# Patient Record
Sex: Male | Born: 1965
Health system: Southern US, Community
[De-identification: ages and names within clinical notes are randomized; demographics above are authoritative.]

## PROBLEM LIST (undated history)

## (undated) DIAGNOSIS — R7989 Other specified abnormal findings of blood chemistry: Secondary | ICD-10-CM

## (undated) DIAGNOSIS — R269 Unspecified abnormalities of gait and mobility: Secondary | ICD-10-CM

## (undated) DIAGNOSIS — Z79899 Other long term (current) drug therapy: Secondary | ICD-10-CM

## (undated) DIAGNOSIS — F329 Major depressive disorder, single episode, unspecified: Secondary | ICD-10-CM

## (undated) DIAGNOSIS — F29 Unspecified psychosis not due to a substance or known physiological condition: Secondary | ICD-10-CM

## (undated) DIAGNOSIS — B20 Human immunodeficiency virus [HIV] disease: Secondary | ICD-10-CM

## (undated) DIAGNOSIS — I10 Essential (primary) hypertension: Secondary | ICD-10-CM

## (undated) DIAGNOSIS — R5383 Other fatigue: Secondary | ICD-10-CM

## (undated) DIAGNOSIS — K2100 Gastro-esophageal reflux disease with esophagitis, without bleeding: Secondary | ICD-10-CM

## (undated) DIAGNOSIS — F71 Moderate intellectual disabilities: Secondary | ICD-10-CM

## (undated) DIAGNOSIS — M79609 Pain in unspecified limb: Secondary | ICD-10-CM

## (undated) DIAGNOSIS — S090XXA Injury of blood vessels of head, not elsewhere classified, initial encounter: Secondary | ICD-10-CM

## (undated) DIAGNOSIS — R5381 Other malaise: Secondary | ICD-10-CM

## (undated) DIAGNOSIS — S159XXA Injury of unspecified blood vessel at neck level, initial encounter: Secondary | ICD-10-CM

## (undated) DIAGNOSIS — F489 Nonpsychotic mental disorder, unspecified: Secondary | ICD-10-CM

## (undated) DIAGNOSIS — R1311 Dysphagia, oral phase: Secondary | ICD-10-CM

## (undated) DIAGNOSIS — G4700 Insomnia, unspecified: Secondary | ICD-10-CM

## (undated) DIAGNOSIS — E785 Hyperlipidemia, unspecified: Secondary | ICD-10-CM

## (undated) DIAGNOSIS — K769 Liver disease, unspecified: Secondary | ICD-10-CM

## (undated) DIAGNOSIS — H6120 Impacted cerumen, unspecified ear: Secondary | ICD-10-CM

## (undated) DIAGNOSIS — L03039 Cellulitis of unspecified toe: Secondary | ICD-10-CM

## (undated) DIAGNOSIS — F3289 Other specified depressive episodes: Secondary | ICD-10-CM

## (undated) DIAGNOSIS — E870 Hyperosmolality and hypernatremia: Secondary | ICD-10-CM

## (undated) HISTORY — DX: Injury of unspecified blood vessel at neck level, initial encounter: S15.9XXA

## (undated) HISTORY — DX: Gastro-esophageal reflux disease with esophagitis, without bleeding: K21.00

## (undated) HISTORY — DX: Injury of unspecified blood vessel at neck level, initial encounter: S09.0XXA

## (undated) HISTORY — DX: Hyperlipidemia, unspecified: E78.5

## (undated) HISTORY — DX: Other long term (current) drug therapy: Z79.899

## (undated) HISTORY — DX: Unspecified psychosis not due to a substance or known physiological condition: F29

## (undated) HISTORY — DX: Liver disease, unspecified: K76.9

## (undated) HISTORY — DX: Other specified depressive episodes: F32.89

## (undated) HISTORY — DX: Other malaise: R53.81

## (undated) HISTORY — DX: Major depressive disorder, single episode, unspecified: F32.9

## (undated) HISTORY — DX: Nonpsychotic mental disorder, unspecified: F48.9

## (undated) HISTORY — DX: Moderate intellectual disabilities: F71

## (undated) HISTORY — DX: Unspecified abnormalities of gait and mobility: R26.9

## (undated) HISTORY — DX: Essential (primary) hypertension: I10

## (undated) HISTORY — DX: Hyperosmolality and hypernatremia: E87.0

## (undated) HISTORY — DX: Impacted cerumen, unspecified ear: H61.20

## (undated) HISTORY — DX: Other fatigue: R53.83

## (undated) HISTORY — DX: Dysphagia, oral phase: R13.11

## (undated) HISTORY — DX: Gastro-esophageal reflux disease with esophagitis: K21.0

## (undated) HISTORY — DX: Pain in unspecified limb: M79.609

## (undated) HISTORY — DX: Human immunodeficiency virus (HIV) disease: B20

## (undated) HISTORY — DX: Other specified abnormal findings of blood chemistry: R79.89

## (undated) HISTORY — PX: CRANIOTOMY: SHX93

## (undated) HISTORY — DX: Insomnia, unspecified: G47.00

## (undated) HISTORY — DX: Cellulitis of unspecified toe: L03.039

---

## 1997-12-27 ENCOUNTER — Emergency Department (HOSPITAL_COMMUNITY): Admission: EM | Admit: 1997-12-27 | Discharge: 1997-12-27 | Payer: Self-pay | Admitting: Emergency Medicine

## 2002-06-06 ENCOUNTER — Inpatient Hospital Stay (HOSPITAL_COMMUNITY): Admission: EM | Admit: 2002-06-06 | Discharge: 2002-06-11 | Payer: Self-pay | Admitting: Psychiatry

## 2003-04-05 DIAGNOSIS — Z9911 Dependence on respirator [ventilator] status: Secondary | ICD-10-CM

## 2003-04-05 DIAGNOSIS — Z8679 Personal history of other diseases of the circulatory system: Secondary | ICD-10-CM

## 2003-04-05 DIAGNOSIS — Z87828 Personal history of other (healed) physical injury and trauma: Secondary | ICD-10-CM

## 2003-04-05 HISTORY — DX: Personal history of other diseases of the circulatory system: Z86.79

## 2003-04-05 HISTORY — DX: Dependence on respirator (ventilator) status: Z99.11

## 2003-04-05 HISTORY — PX: TRACHEAL SURGERY: SHX1096

## 2003-04-05 HISTORY — DX: Personal history of other (healed) physical injury and trauma: Z87.828

## 2003-04-05 HISTORY — PX: PEG PLACEMENT: SHX5437

## 2004-01-07 ENCOUNTER — Inpatient Hospital Stay (HOSPITAL_COMMUNITY): Admission: EM | Admit: 2004-01-07 | Discharge: 2004-03-24 | Payer: Self-pay

## 2004-01-07 ENCOUNTER — Ambulatory Visit: Payer: Self-pay | Admitting: Infectious Diseases

## 2004-06-18 ENCOUNTER — Ambulatory Visit (HOSPITAL_COMMUNITY): Admission: RE | Admit: 2004-06-18 | Discharge: 2004-06-18 | Payer: Self-pay | Admitting: Internal Medicine

## 2005-01-20 ENCOUNTER — Encounter (INDEPENDENT_AMBULATORY_CARE_PROVIDER_SITE_OTHER): Payer: Self-pay | Admitting: *Deleted

## 2005-01-20 ENCOUNTER — Ambulatory Visit: Payer: Self-pay | Admitting: Infectious Diseases

## 2005-01-20 ENCOUNTER — Ambulatory Visit (HOSPITAL_COMMUNITY): Admission: RE | Admit: 2005-01-20 | Discharge: 2005-01-20 | Payer: Self-pay | Admitting: Infectious Diseases

## 2005-01-20 LAB — CONVERTED CEMR LAB
CD4 Count: 290 microliters
CD4 T Cell Abs: 290

## 2005-02-23 ENCOUNTER — Ambulatory Visit: Payer: Self-pay | Admitting: Infectious Diseases

## 2005-04-28 ENCOUNTER — Encounter (INDEPENDENT_AMBULATORY_CARE_PROVIDER_SITE_OTHER): Payer: Self-pay | Admitting: *Deleted

## 2005-04-28 ENCOUNTER — Ambulatory Visit: Payer: Self-pay | Admitting: Infectious Diseases

## 2005-04-28 LAB — CONVERTED CEMR LAB
CD4 Count: 260 microliters
HIV 1 RNA Quant: 399 copies/mL

## 2005-05-05 ENCOUNTER — Ambulatory Visit (HOSPITAL_COMMUNITY): Admission: RE | Admit: 2005-05-05 | Discharge: 2005-05-05 | Payer: Self-pay | Admitting: Internal Medicine

## 2005-07-14 ENCOUNTER — Encounter (INDEPENDENT_AMBULATORY_CARE_PROVIDER_SITE_OTHER): Payer: Self-pay | Admitting: *Deleted

## 2005-07-14 ENCOUNTER — Encounter: Admission: RE | Admit: 2005-07-14 | Discharge: 2005-07-14 | Payer: Self-pay | Admitting: Infectious Diseases

## 2005-07-14 ENCOUNTER — Ambulatory Visit: Payer: Self-pay | Admitting: Infectious Diseases

## 2005-07-14 LAB — CONVERTED CEMR LAB
CD4 Count: 490 microliters
HIV 1 RNA Quant: 399 copies/mL

## 2006-04-13 ENCOUNTER — Encounter: Admission: RE | Admit: 2006-04-13 | Discharge: 2006-04-13 | Payer: Self-pay | Admitting: Infectious Diseases

## 2006-04-13 ENCOUNTER — Encounter (INDEPENDENT_AMBULATORY_CARE_PROVIDER_SITE_OTHER): Payer: Self-pay | Admitting: *Deleted

## 2006-04-13 ENCOUNTER — Ambulatory Visit: Payer: Self-pay | Admitting: Infectious Diseases

## 2006-04-13 LAB — CONVERTED CEMR LAB
CD4 Count: 280 microliters
HIV 1 RNA Quant: 109 copies/mL

## 2006-05-29 ENCOUNTER — Encounter (INDEPENDENT_AMBULATORY_CARE_PROVIDER_SITE_OTHER): Payer: Self-pay | Admitting: *Deleted

## 2006-05-29 LAB — CONVERTED CEMR LAB

## 2006-06-11 ENCOUNTER — Encounter (INDEPENDENT_AMBULATORY_CARE_PROVIDER_SITE_OTHER): Payer: Self-pay | Admitting: *Deleted

## 2006-09-18 DIAGNOSIS — B20 Human immunodeficiency virus [HIV] disease: Secondary | ICD-10-CM

## 2006-09-20 IMAGING — CR DG ANKLE PORT 2V*R*
1 series · 1 of 1 positions shown · non-contrast
Comparison: Prior two-view study on the same day.

CLINICAL DATA: Trauma.

RIGHT ANKLE, SINGLE VIEW

[view not recorded]
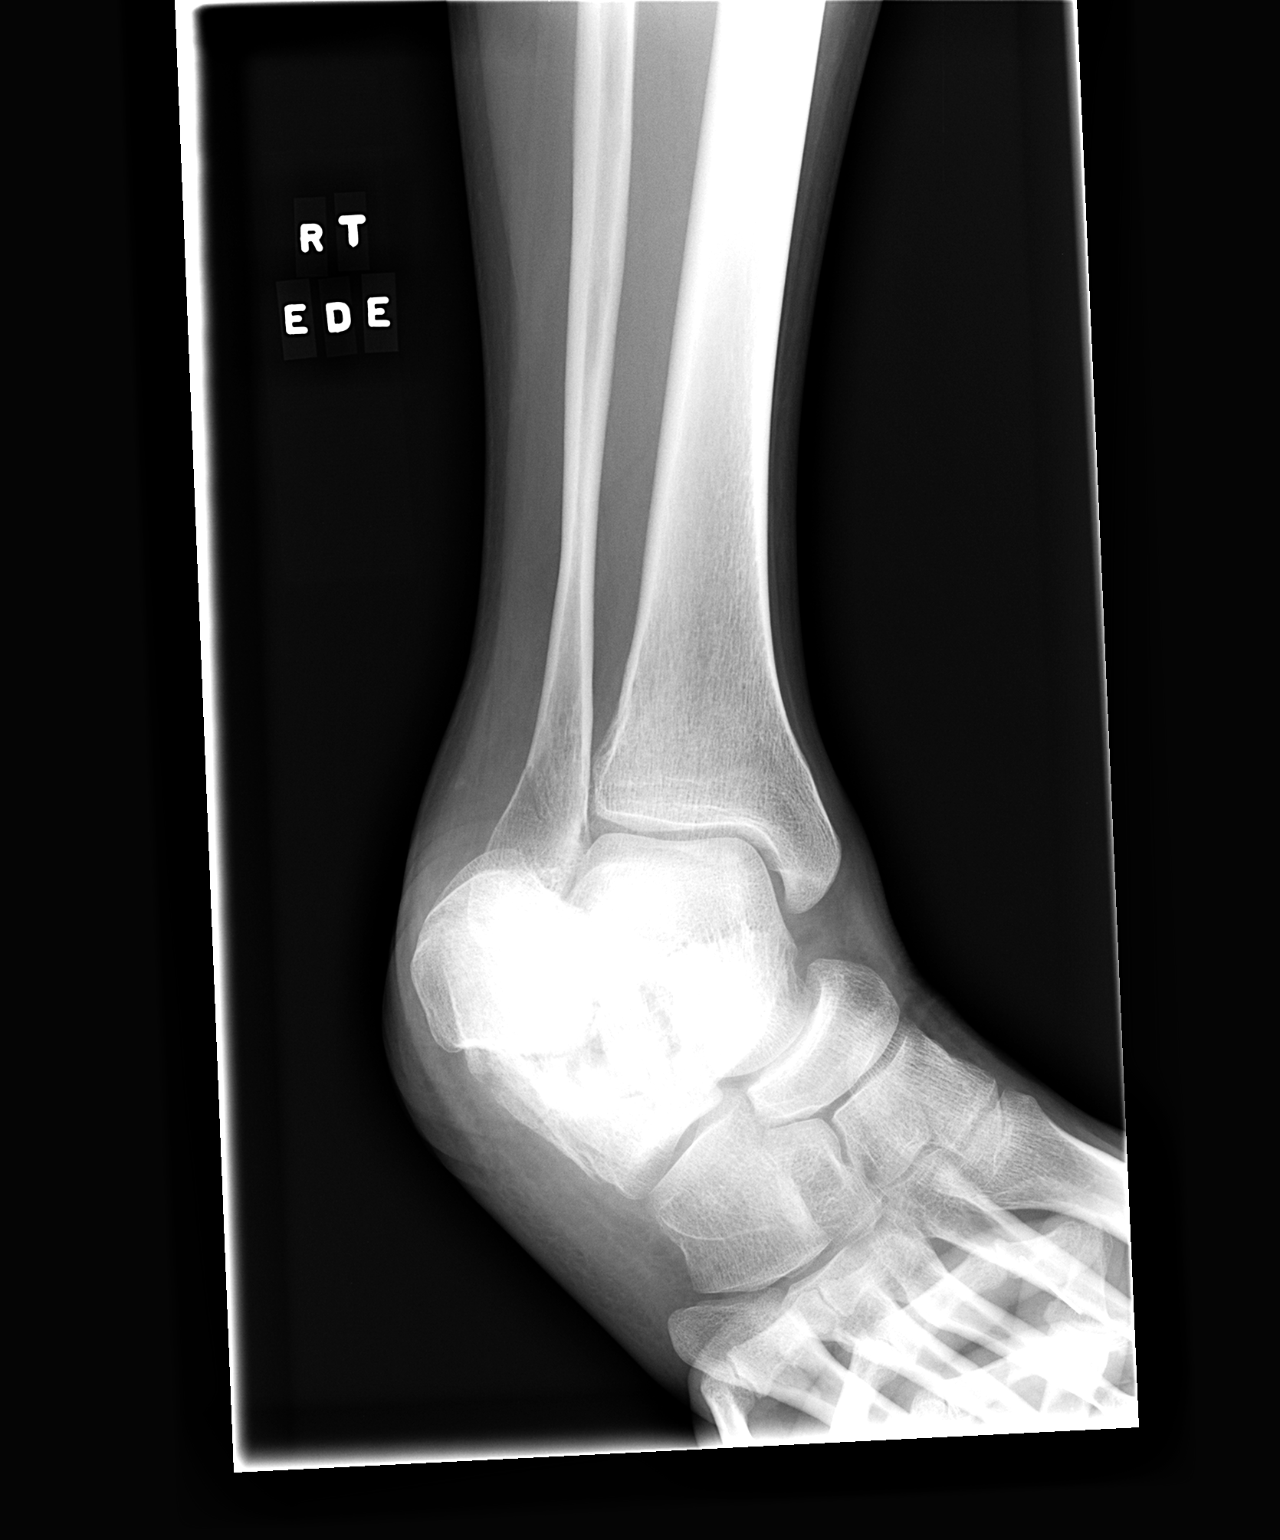

[1 of 1 positions shown; findings below may reference images not displayed]

Additional oblique view of the right ankle was obtained to supplement the AP and lateral views
obtained previously. There is an oblique nondisplaced fracture through the lateral malleolus which
was not appreciated on the prior study. Complex fracture seen through the mid calcaneus. It is
unclear whether fracture also extends through the talus.

IMPRESSION

Nondisplaced fracture of distal fibula. Complex comminuted fracture of the calcaneus. Talar
fracture is not completely excluded. CT of the right foot and ankle may be helpful to further
clarify injuries.

## 2006-09-20 IMAGING — CR DG CHEST 1V PORT
1 series · 1 of 1 positions shown · non-contrast
Comparison: 3757 hours

Clinical:   Trauma. Status post intubation.

Chest, single view, 7293 hours

[view not recorded]
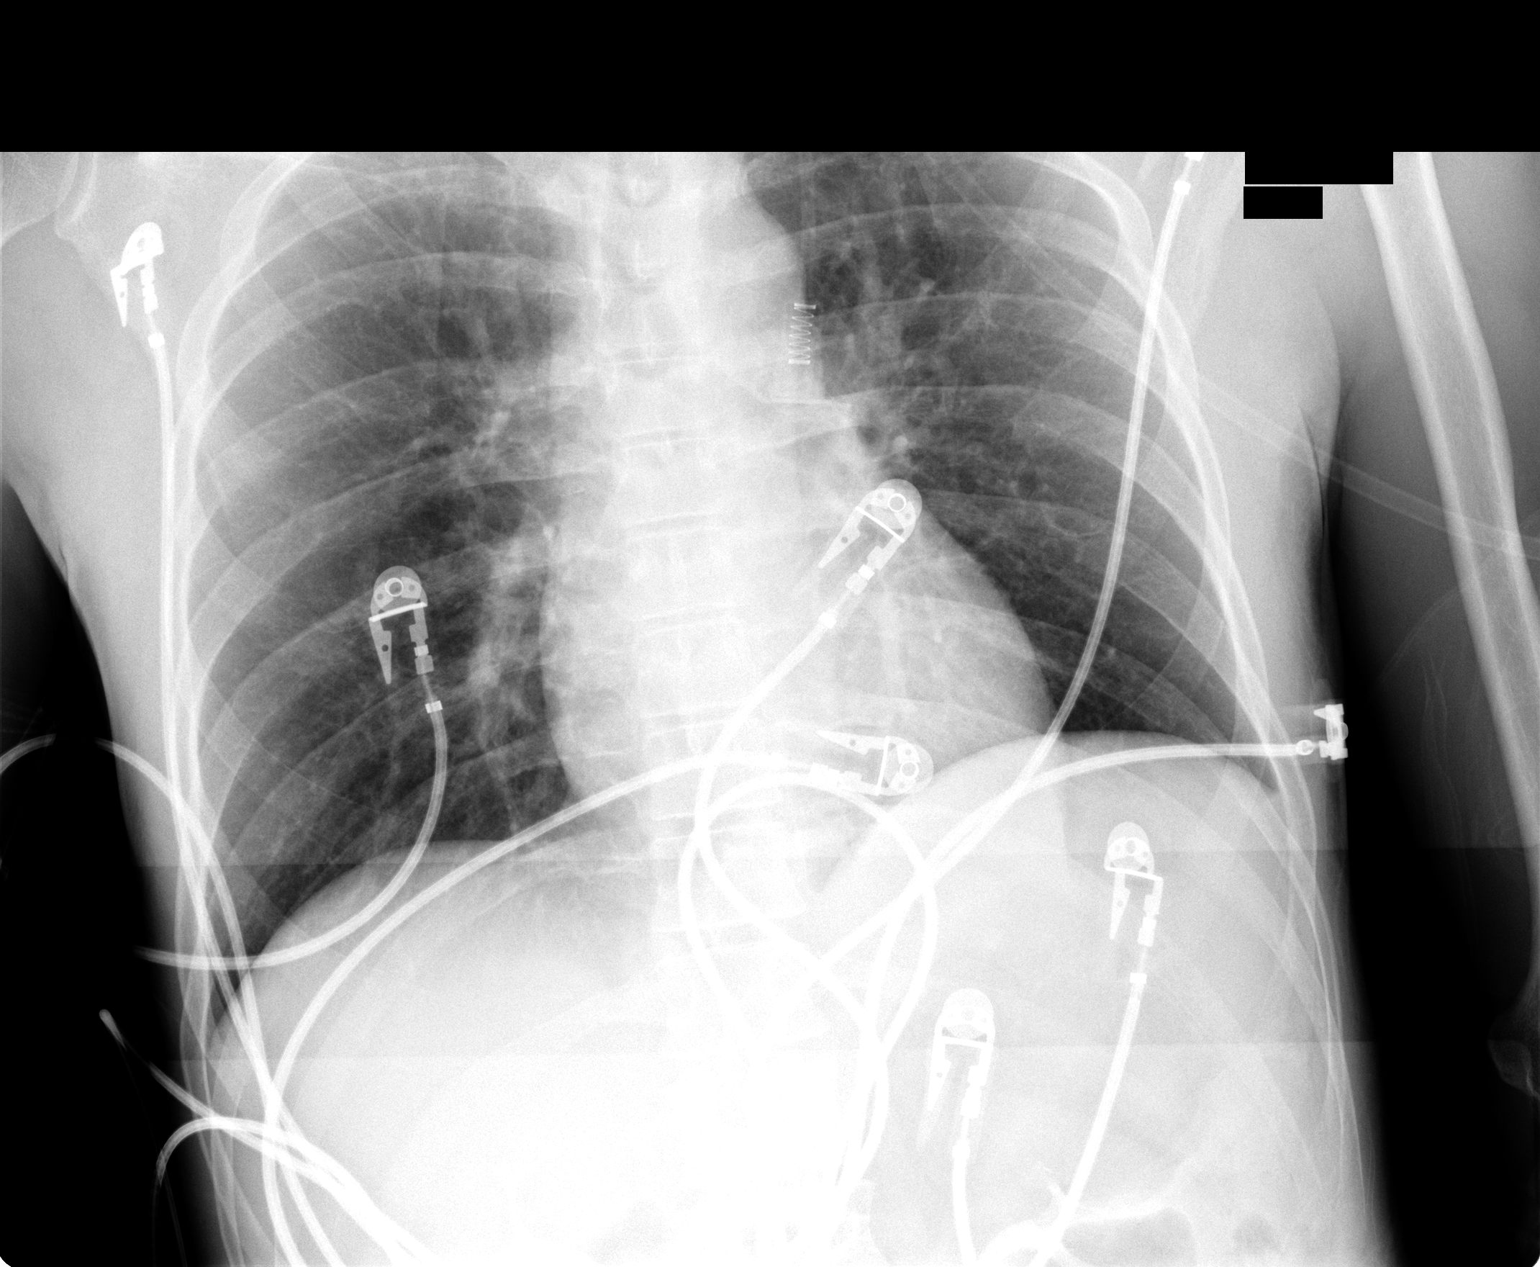

[1 of 1 positions shown; findings below may reference images not displayed]

The patient has been intubated. The endotracheal tube lies approximately 5 cm above the carina.
Lung show stable appearance without pneumothorax or focal pulmonary density.
IMPRESSION: Endotracheal tube tip lies 5 cm above the carina.

## 2006-09-21 IMAGING — CT CT HEAD W/O CM
1 of 2 series · 13 of 30 positions shown, 17 images · non-contrast
Comparison: 01/07/04.

CLINICAL DATA: CT HEAD WITHOUT CONTRAST 01/08/04
TECHNIQUE: Standard noncontrast axial head CT.

[Series 2: brain · axial · 0.47mm/px · z∈[+148,+289]mm · 13 of 36 slices shown, 17 images]
[im 3/36  brain]
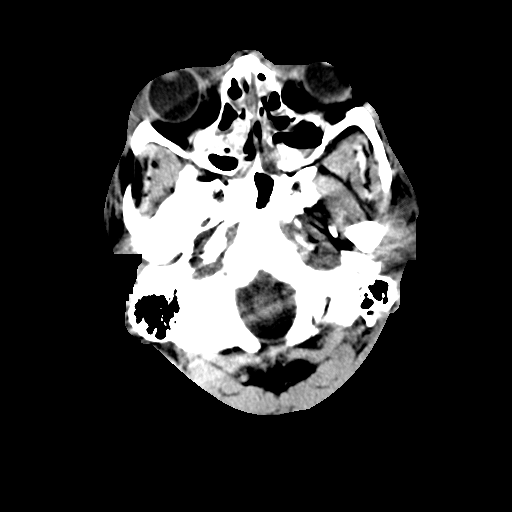
[im 3/36  bone]
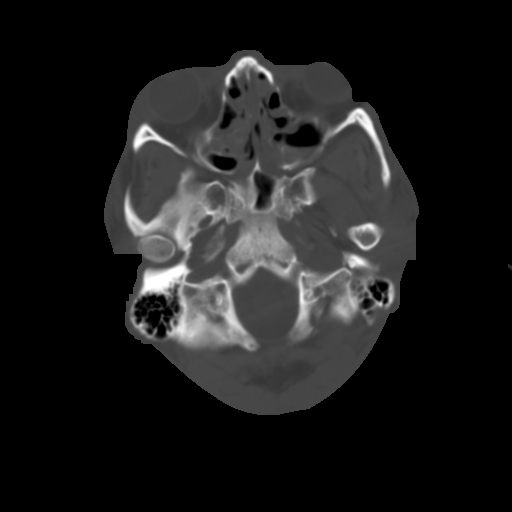
[im 6/36  brain]
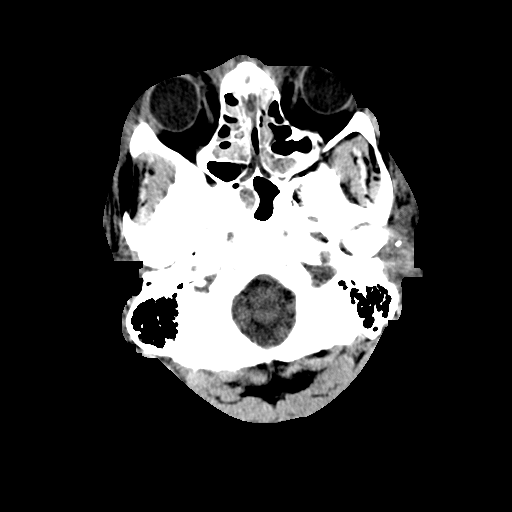
[im 8/36  brain]
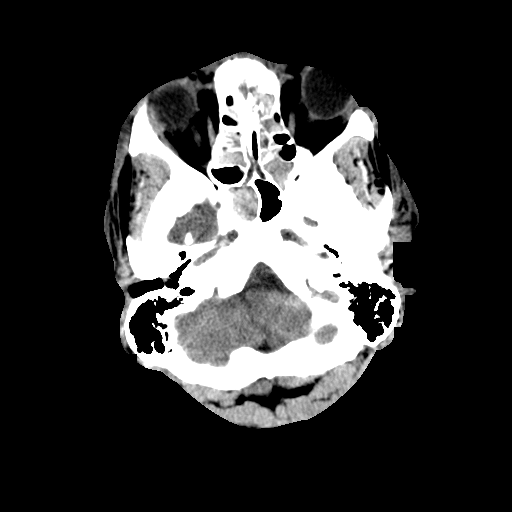
[im 11/36  brain]
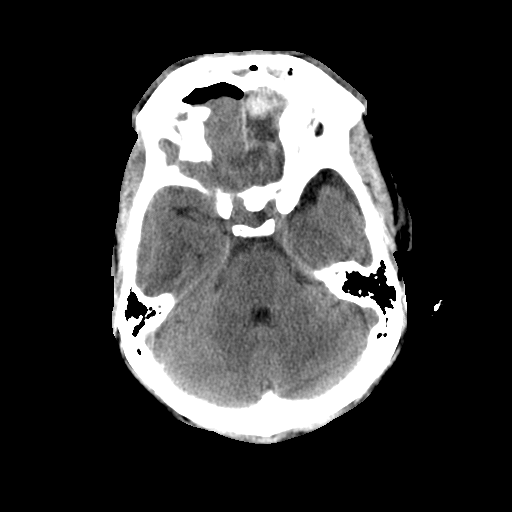
[im 13/36  brain]
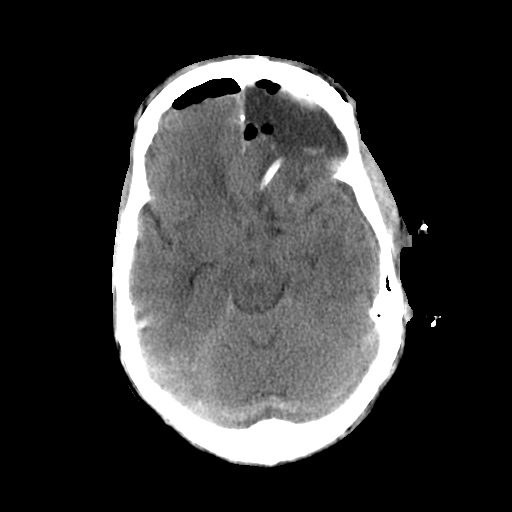
[im 13/36  bone]
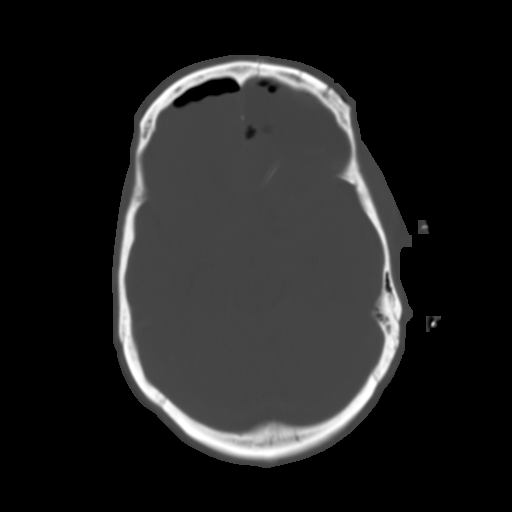
[im 16/36  brain]
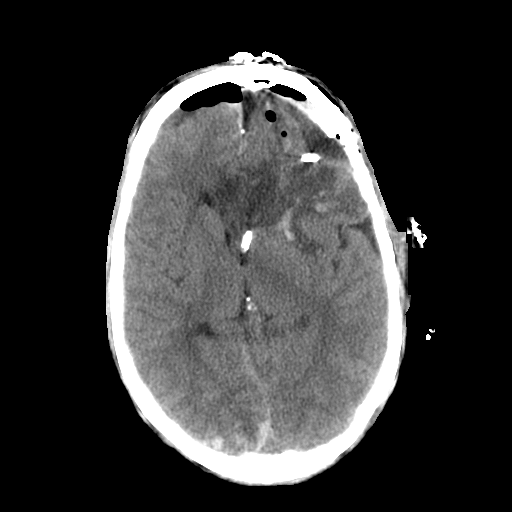
[im 18/36  brain]
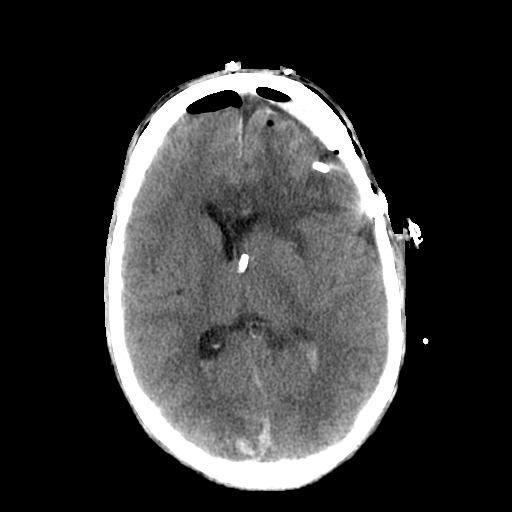
[im 21/36  brain]
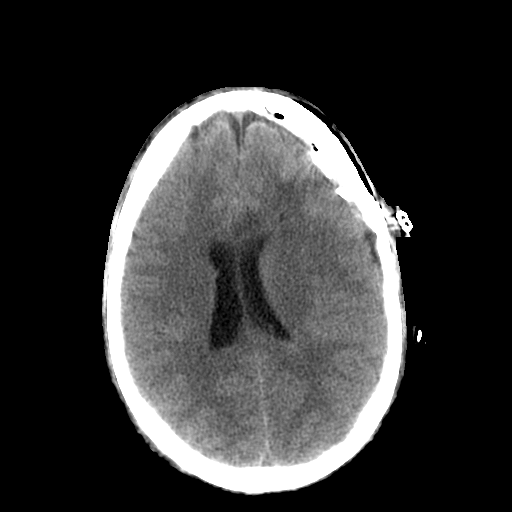
[im 23/36  brain]
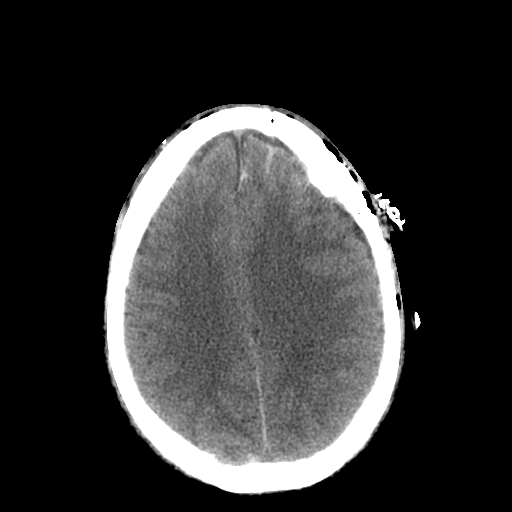
[im 23/36  bone]
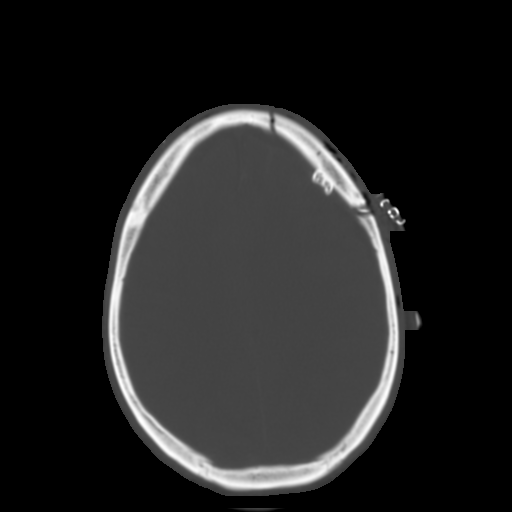
[im 26/36  brain]
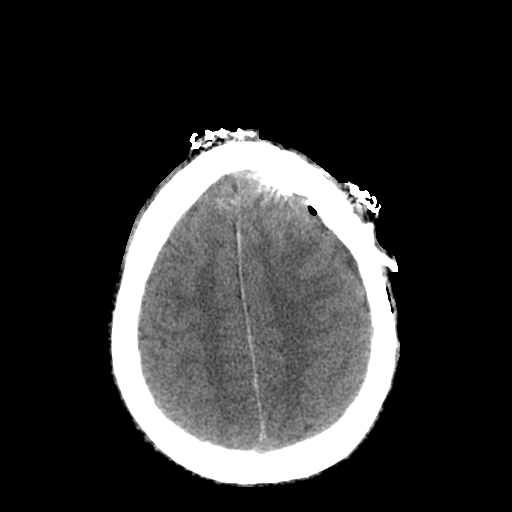
[im 28/36  brain]
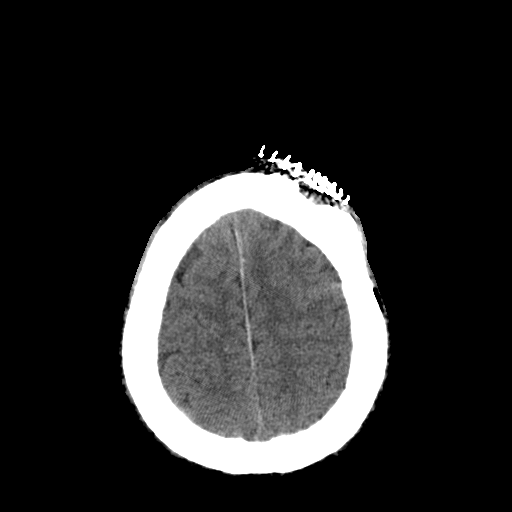
[im 31/36  brain]
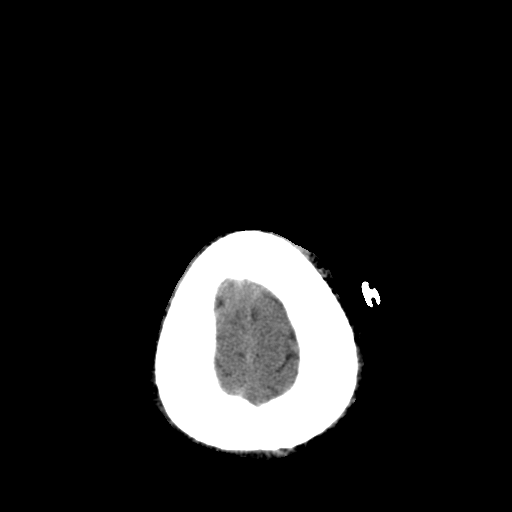
[im 33/36  brain]
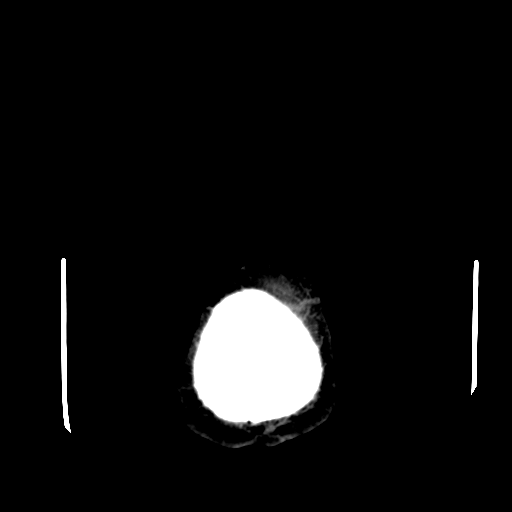
[im 33/36  bone]
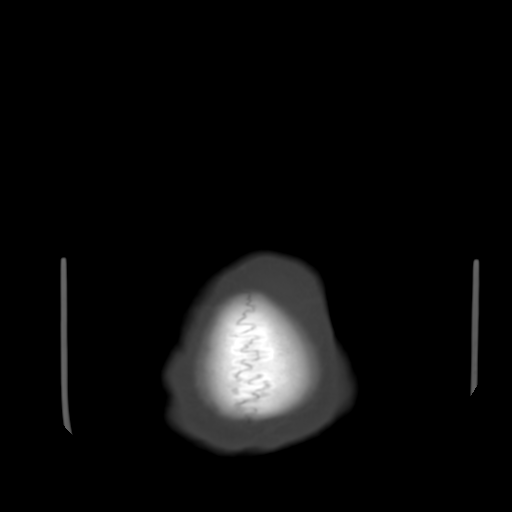

[13 of 30 positions shown; findings below may reference images not displayed]

FINDINGS: Compared to the prior study, the large left frontal lobe intracranial hemorrhage has been nearly completely evacuated.  A small amount of hemorrhage remains adjacent to the right frontal horn extending inferiorly.  Diffuse hypoattenuation is evident throughout the left frontal lobe.  There are two surgical drains in place, one which extends through the left frontal lobe into the ventricular system and a second which is subdural in location overlying the left frontal lobe.  There are scattered foci of gas throughout the brain consistent with pneumocephalus.  There is less midline shift now measuring approximately 9 mm, previously measuring 15 mm.  Intraventricular hemorrhage is present in the occipital horns bilaterally.  Medial right frontal hemorrhagic contusion is again noted.  Diffuse scattered subarachnoid blood is evident particularly on the left side.  Also some of the subdural blood on the left side has been evacuated from the surgery.  Minimal subdural hemorrhage is present posteriorly along the falx and the tentorium on the right.  Basilar cisterns remain patent. 
Patient is status post recent left frontal craniotomy.  
Complex fractures are again noted through the superior right orbit extending into the frontal and ethmoidal sinuses.  Diffuse sinus fluid and secretions are noted.  
IMPRESSION
1.  Status post left frontal craniotomy with evacuation of an acute left frontal expanding intracranial hemorrhage.  Improvement also in the amount of left subdural hemorrhage.  
2.  Less left to right midline shift now measuring 9 mm previously measuring 15 mm.

## 2006-10-24 DIAGNOSIS — T148XXA Other injury of unspecified body region, initial encounter: Secondary | ICD-10-CM | POA: Insufficient documentation

## 2006-10-24 DIAGNOSIS — Z87828 Personal history of other (healed) physical injury and trauma: Secondary | ICD-10-CM

## 2006-12-28 ENCOUNTER — Ambulatory Visit: Payer: Self-pay | Admitting: Infectious Diseases

## 2006-12-28 ENCOUNTER — Encounter: Admission: RE | Admit: 2006-12-28 | Discharge: 2006-12-28 | Payer: Self-pay | Admitting: Infectious Diseases

## 2006-12-28 LAB — CONVERTED CEMR LAB
ALT: 79 units/L — ABNORMAL HIGH (ref 0–53)
AST: 35 units/L (ref 0–37)
Albumin: 4.4 g/dL (ref 3.5–5.2)
Alkaline Phosphatase: 191 units/L — ABNORMAL HIGH (ref 39–117)
BUN: 13 mg/dL (ref 6–23)
Basophils Absolute: 0.1 10*3/uL (ref 0.0–0.1)
Basophils Relative: 1 % (ref 0–1)
CO2: 21 meq/L (ref 19–32)
Calcium: 8.9 mg/dL (ref 8.4–10.5)
Chloride: 115 meq/L — ABNORMAL HIGH (ref 96–112)
Creatinine, Ser: 0.86 mg/dL (ref 0.40–1.50)
Eosinophils Absolute: 0.1 10*3/uL (ref 0.0–0.7)
Eosinophils Relative: 1 % (ref 0–5)
Glucose, Bld: 90 mg/dL (ref 70–99)
HCT: 45.5 % (ref 39.0–52.0)
HIV 1 RNA Quant: 363 copies/mL — ABNORMAL HIGH (ref <50)
HIV-1 RNA Quant, Log: 2.56 — ABNORMAL HIGH (ref <1.70)
Hemoglobin: 14.6 g/dL (ref 13.0–17.0)
Lymphocytes Relative: 35 % (ref 12–46)
Lymphs Abs: 2.4 10*3/uL (ref 0.7–3.3)
MCHC: 32.1 g/dL (ref 30.0–36.0)
MCV: 89.7 fL (ref 78.0–100.0)
Monocytes Absolute: 0.3 10*3/uL (ref 0.2–0.7)
Monocytes Relative: 4 % (ref 3–11)
Neutro Abs: 4.2 10*3/uL (ref 1.7–7.7)
Neutrophils Relative %: 60 % (ref 43–77)
Platelets: 312 10*3/uL (ref 150–400)
Potassium: 3.7 meq/L (ref 3.5–5.3)
RBC: 5.07 M/uL (ref 4.22–5.81)
RDW: 16.2 % — ABNORMAL HIGH (ref 11.5–14.0)
Sodium: 148 meq/L — ABNORMAL HIGH (ref 135–145)
Total Bilirubin: 0.3 mg/dL (ref 0.3–1.2)
Total Protein: 7.8 g/dL (ref 6.0–8.3)
WBC: 7 10*3/uL (ref 4.0–10.5)

## 2006-12-29 ENCOUNTER — Encounter (INDEPENDENT_AMBULATORY_CARE_PROVIDER_SITE_OTHER): Payer: Self-pay | Admitting: Licensed Clinical Social Worker

## 2007-04-03 ENCOUNTER — Encounter: Payer: Self-pay | Admitting: Infectious Diseases

## 2008-06-19 ENCOUNTER — Ambulatory Visit: Payer: Self-pay | Admitting: Infectious Diseases

## 2008-06-19 ENCOUNTER — Encounter (INDEPENDENT_AMBULATORY_CARE_PROVIDER_SITE_OTHER): Payer: Self-pay | Admitting: Licensed Clinical Social Worker

## 2008-06-19 LAB — CONVERTED CEMR LAB
ALT: 53 units/L (ref 0–53)
AST: 31 units/L (ref 0–37)
Albumin: 4.5 g/dL (ref 3.5–5.2)
Alkaline Phosphatase: 189 units/L — ABNORMAL HIGH (ref 39–117)
BUN: 13 mg/dL (ref 6–23)
Basophils Absolute: 0 10*3/uL (ref 0.0–0.1)
Basophils Relative: 1 % (ref 0–1)
CO2: 20 meq/L (ref 19–32)
Calcium: 8.9 mg/dL (ref 8.4–10.5)
Chloride: 110 meq/L (ref 96–112)
Cholesterol: 201 mg/dL — ABNORMAL HIGH (ref 0–200)
Creatinine, Ser: 0.98 mg/dL (ref 0.40–1.50)
Eosinophils Absolute: 0.1 10*3/uL (ref 0.0–0.7)
Eosinophils Relative: 2 % (ref 0–5)
Glucose, Bld: 78 mg/dL (ref 70–99)
HCT: 44.5 % (ref 39.0–52.0)
HDL: 48 mg/dL (ref >39)
HIV 1 RNA Quant: 302 copies/mL — ABNORMAL HIGH (ref <48)
HIV-1 RNA Quant, Log: 2.48 — ABNORMAL HIGH (ref <1.68)
Hemoglobin: 14.3 g/dL (ref 13.0–17.0)
LDL Cholesterol: 133 mg/dL — ABNORMAL HIGH (ref 0–99)
Lymphocytes Relative: 34 % (ref 12–46)
Lymphs Abs: 1.8 10*3/uL (ref 0.7–4.0)
MCHC: 32.1 g/dL (ref 30.0–36.0)
MCV: 90.3 fL (ref 78.0–100.0)
Monocytes Absolute: 0.2 10*3/uL (ref 0.1–1.0)
Monocytes Relative: 4 % (ref 3–12)
Neutro Abs: 3.1 10*3/uL (ref 1.7–7.7)
Neutrophils Relative %: 59 % (ref 43–77)
Platelets: 292 10*3/uL (ref 150–400)
Potassium: 4.1 meq/L (ref 3.5–5.3)
RBC: 4.93 M/uL (ref 4.22–5.81)
RDW: 16.5 % — ABNORMAL HIGH (ref 11.5–15.5)
Sodium: 144 meq/L (ref 135–145)
Total Bilirubin: 0.3 mg/dL (ref 0.3–1.2)
Total CHOL/HDL Ratio: 4.2
Total Protein: 7.1 g/dL (ref 6.0–8.3)
Triglycerides: 101 mg/dL (ref <150)
VLDL: 20 mg/dL (ref 0–40)
WBC: 5.3 10*3/uL (ref 4.0–10.5)

## 2008-07-03 ENCOUNTER — Ambulatory Visit: Payer: Self-pay | Admitting: Infectious Diseases

## 2008-12-11 ENCOUNTER — Ambulatory Visit: Payer: Self-pay | Admitting: Infectious Diseases

## 2008-12-11 LAB — CONVERTED CEMR LAB
HIV 1 RNA Quant: 188 copies/mL — ABNORMAL HIGH (ref <48)
HIV-1 RNA Quant, Log: 2.27 — ABNORMAL HIGH (ref <1.68)

## 2008-12-12 ENCOUNTER — Encounter: Payer: Self-pay | Admitting: Infectious Diseases

## 2008-12-12 LAB — CONVERTED CEMR LAB
ALT: 37 units/L (ref 0–53)
AST: 22 units/L (ref 0–37)
Albumin: 4.3 g/dL (ref 3.5–5.2)
Alkaline Phosphatase: 138 units/L — ABNORMAL HIGH (ref 39–117)
BUN: 12 mg/dL (ref 6–23)
Basophils Absolute: 0.1 10*3/uL (ref 0.0–0.1)
Basophils Relative: 1 % (ref 0–1)
CO2: 22 meq/L (ref 19–32)
Calcium: 8.9 mg/dL (ref 8.4–10.5)
Chloride: 103 meq/L (ref 96–112)
Creatinine, Ser: 1.24 mg/dL (ref 0.40–1.50)
Eosinophils Absolute: 0.1 10*3/uL (ref 0.0–0.7)
Eosinophils Relative: 2 % (ref 0–5)
Glucose, Bld: 105 mg/dL — ABNORMAL HIGH (ref 70–99)
HCT: 42.2 % (ref 39.0–52.0)
Hemoglobin: 13.8 g/dL (ref 13.0–17.0)
Lymphocytes Relative: 39 % (ref 12–46)
Lymphs Abs: 2.1 10*3/uL (ref 0.7–4.0)
MCHC: 32.7 g/dL (ref 30.0–36.0)
MCV: 86.3 fL (ref >=78.0)
Monocytes Absolute: 0.3 10*3/uL (ref 0.1–1.0)
Monocytes Relative: 5 % (ref 3–12)
Neutro Abs: 2.8 10*3/uL (ref 1.7–7.7)
Neutrophils Relative %: 52 % (ref 43–77)
Platelets: 287 10*3/uL (ref 150–400)
Potassium: 4 meq/L (ref 3.5–5.3)
RBC: 4.89 M/uL (ref 4.22–5.81)
RDW: 14.9 % (ref 11.5–15.5)
Sodium: 136 meq/L (ref 135–145)
Total Bilirubin: 0.3 mg/dL (ref 0.3–1.2)
Total Protein: 7.4 g/dL (ref 6.0–8.3)
WBC: 5.4 10*3/uL (ref 4.0–10.5)

## 2008-12-18 ENCOUNTER — Ambulatory Visit: Payer: Self-pay | Admitting: Infectious Diseases

## 2009-03-24 ENCOUNTER — Encounter: Payer: Self-pay | Admitting: Infectious Diseases

## 2009-08-27 ENCOUNTER — Ambulatory Visit: Payer: Self-pay | Admitting: Infectious Diseases

## 2009-08-27 LAB — CONVERTED CEMR LAB
ALT: 53 units/L (ref 0–53)
AST: 25 units/L (ref 0–37)
Albumin: 4.4 g/dL (ref 3.5–5.2)
Alkaline Phosphatase: 141 units/L — ABNORMAL HIGH (ref 39–117)
BUN: 16 mg/dL (ref 6–23)
Basophils Absolute: 0.1 10*3/uL (ref 0.0–0.1)
Basophils Relative: 1 % (ref 0–1)
CO2: 23 meq/L (ref 19–32)
Calcium: 9.3 mg/dL (ref 8.4–10.5)
Chloride: 102 meq/L (ref 96–112)
Cholesterol: 168 mg/dL (ref 0–200)
Creatinine, Ser: 1.4 mg/dL (ref 0.40–1.50)
Eosinophils Absolute: 0.1 10*3/uL (ref 0.0–0.7)
Eosinophils Relative: 2 % (ref 0–5)
Glucose, Bld: 84 mg/dL (ref 70–99)
HCT: 39.6 % (ref 39.0–52.0)
HDL: 44 mg/dL (ref >39)
HIV 1 RNA Quant: <48 copies/mL (ref <48)
HIV-1 RNA Quant, Log: <1.68 (ref <1.68)
Hemoglobin: 13.3 g/dL (ref 13.0–17.0)
LDL Cholesterol: 105 mg/dL — ABNORMAL HIGH (ref 0–99)
Lymphocytes Relative: 38 % (ref 12–46)
Lymphs Abs: 2.2 10*3/uL (ref 0.7–4.0)
MCHC: 33.6 g/dL (ref 30.0–36.0)
MCV: 82.3 fL (ref 78.0–100.0)
Monocytes Absolute: 0.4 10*3/uL (ref 0.1–1.0)
Monocytes Relative: 7 % (ref 3–12)
Neutro Abs: 3 10*3/uL (ref 1.7–7.7)
Neutrophils Relative %: 53 % (ref 43–77)
Platelets: 327 10*3/uL (ref 150–400)
Potassium: 3.7 meq/L (ref 3.5–5.3)
RBC: 4.81 M/uL (ref 4.22–5.81)
RDW: 14.4 % (ref 11.5–15.5)
Sodium: 137 meq/L (ref 135–145)
Total Bilirubin: 0.4 mg/dL (ref 0.3–1.2)
Total CHOL/HDL Ratio: 3.8
Total Protein: 7.3 g/dL (ref 6.0–8.3)
Triglycerides: 96 mg/dL (ref <150)
VLDL: 19 mg/dL (ref 0–40)
WBC: 5.7 10*3/uL (ref 4.0–10.5)

## 2009-09-10 ENCOUNTER — Ambulatory Visit: Payer: Self-pay | Admitting: Infectious Diseases

## 2010-01-21 ENCOUNTER — Ambulatory Visit: Payer: Self-pay | Admitting: Infectious Diseases

## 2010-01-21 LAB — CONVERTED CEMR LAB
ALT: 41 units/L (ref 0–53)
AST: 24 units/L (ref 0–37)
Albumin: 4.7 g/dL (ref 3.5–5.2)
Alkaline Phosphatase: 127 units/L — ABNORMAL HIGH (ref 39–117)
BUN: 16 mg/dL (ref 6–23)
Basophils Absolute: 0.1 10*3/uL (ref 0.0–0.1)
Basophils Relative: 1 % (ref 0–1)
CO2: 25 meq/L (ref 19–32)
Calcium: 9.7 mg/dL (ref 8.4–10.5)
Chloride: 102 meq/L (ref 96–112)
Cholesterol: 215 mg/dL — ABNORMAL HIGH (ref 0–200)
Creatinine, Ser: 1.22 mg/dL (ref 0.40–1.50)
Eosinophils Absolute: 0.1 10*3/uL (ref 0.0–0.7)
Eosinophils Relative: 2 % (ref 0–5)
Glucose, Bld: 81 mg/dL (ref 70–99)
HCT: 43.3 % (ref 39.0–52.0)
HDL: 48 mg/dL (ref >39)
HIV 1 RNA Quant: <20 copies/mL (ref <20)
HIV-1 RNA Quant, Log: <1.3 (ref <1.30)
Hemoglobin: 14.5 g/dL (ref 13.0–17.0)
LDL Cholesterol: 140 mg/dL — ABNORMAL HIGH (ref 0–99)
Lymphocytes Relative: 35 % (ref 12–46)
Lymphs Abs: 1.7 10*3/uL (ref 0.7–4.0)
MCHC: 33.5 g/dL (ref 30.0–36.0)
MCV: 84.6 fL (ref 78.0–100.0)
Monocytes Absolute: 0.3 10*3/uL (ref 0.1–1.0)
Monocytes Relative: 5 % (ref 3–12)
Neutro Abs: 2.9 10*3/uL (ref 1.7–7.7)
Neutrophils Relative %: 58 % (ref 43–77)
Platelets: 293 10*3/uL (ref 150–400)
Potassium: 4 meq/L (ref 3.5–5.3)
RBC: 5.12 M/uL (ref 4.22–5.81)
RDW: 15.7 % — ABNORMAL HIGH (ref 11.5–15.5)
Sodium: 139 meq/L (ref 135–145)
TSH: 2.074 microintl units/mL (ref 0.350–4.500)
Total Bilirubin: 0.3 mg/dL (ref 0.3–1.2)
Total CHOL/HDL Ratio: 4.5
Total Protein: 7.8 g/dL (ref 6.0–8.3)
Triglycerides: 133 mg/dL (ref <150)
VLDL: 27 mg/dL (ref 0–40)
WBC: 5 10*3/uL (ref 4.0–10.5)

## 2010-01-29 ENCOUNTER — Ambulatory Visit: Payer: Self-pay | Admitting: Infectious Diseases

## 2010-03-02 ENCOUNTER — Encounter (INDEPENDENT_AMBULATORY_CARE_PROVIDER_SITE_OTHER): Payer: Self-pay | Admitting: *Deleted

## 2010-04-25 ENCOUNTER — Encounter: Payer: Self-pay | Admitting: Infectious Diseases

## 2010-05-02 LAB — CONVERTED CEMR LAB
ALT: 82 units/L — ABNORMAL HIGH (ref 0–53)
AST: 34 units/L (ref 0–37)
Albumin: 4.6 g/dL (ref 3.5–5.2)
Alkaline Phosphatase: 231 units/L — ABNORMAL HIGH (ref 39–117)
BUN: 11 mg/dL (ref 6–23)
Basophils Absolute: 0 10*3/uL (ref 0.0–0.1)
Basophils Relative: 0 % (ref 0–1)
CO2: 17 meq/L — ABNORMAL LOW (ref 19–32)
Calcium: 9.2 mg/dL (ref 8.4–10.5)
Chloride: 111 meq/L (ref 96–112)
Creatinine, Ser: 1.07 mg/dL (ref 0.40–1.50)
Eosinophils Relative: 1 % (ref 0–5)
Glucose, Bld: 82 mg/dL (ref 70–99)
HCT: 47.1 % (ref 39.0–52.0)
HIV 1 RNA Quant: 109 copies/mL — ABNORMAL HIGH (ref <50)
HIV-1 RNA Quant, Log: 2.04 — ABNORMAL HIGH (ref <1.70)
Hemoglobin: 15.4 g/dL (ref 13.0–17.0)
Lymphocytes Relative: 11 % — ABNORMAL LOW (ref 12–46)
Lymphs Abs: 1.5 10*3/uL (ref 0.7–3.3)
MCHC: 32.7 g/dL (ref 30.0–36.0)
MCV: 89.9 fL (ref 78.0–100.0)
Monocytes Absolute: 0.5 10*3/uL (ref 0.2–0.7)
Monocytes Relative: 4 % (ref 3–11)
Neutro Abs: 11.7 10*3/uL — ABNORMAL HIGH (ref 1.7–7.7)
Neutrophils Relative %: 85 % — ABNORMAL HIGH (ref 43–77)
Platelets: 429 10*3/uL — ABNORMAL HIGH (ref 150–400)
Potassium: 4.1 meq/L (ref 3.5–5.3)
RBC: 5.24 M/uL (ref 4.22–5.81)
RDW: 15.5 % — ABNORMAL HIGH (ref 11.5–14.0)
Sodium: 144 meq/L (ref 135–145)
Total Bilirubin: 0.4 mg/dL (ref 0.3–1.2)
Total Protein: 8.3 g/dL (ref 6.0–8.3)
WBC: 13.9 10*3/uL — ABNORMAL HIGH (ref 4.0–10.5)

## 2010-05-06 NOTE — Miscellaneous (Signed)
  Clinical Lists Changes  Observations: Added new observation of YEARAIDSPOS: 2003  (03/02/2010 9:53) Added new observation of HIV STATUS: CDC-defined AIDS  (03/02/2010 9:53)

## 2010-05-06 NOTE — Assessment & Plan Note (Signed)
Summary: CHECK/FU/CFB   Visit Type:  Follow-up  CC:  f/u appt..  History of Present Illness: Isaac Miller is stable and fine. His mother is with him and he is living with her in supportive environment. He is adherent to his Atripla and his HIV is <48copiesand CD4 610. Hia preventive care is up to date.His meds list has been reconciled.  Preventive Screening-Counseling & Management  Alcohol-Tobacco     Alcohol drinks/day: 0     Smoking Status: never     Year Quit: 2006     Pack years: 1/2 ppd  Caffeine-Diet-Exercise     Caffeine use/day: yes     Does Patient Exercise: no     Type of exercise: walking inside the home     Times/week: daily  Hep-HIV-STD-Contraception     HIV Risk: no risk noted  Safety-Violence-Falls     Seat Belt Use: yes  Comments: declined condoms      Sexual History:  n/a.        Drug Use:  never.     Current Allergies (reviewed today): No known allergies  Social History: Sexual History:  n/a Drug Use:  never  Vital Signs:  Patient profile:   45 year old male Height:      64 inches (162.56 cm) Weight:      161.5 pounds (73.41 kg) BMI:     27.82 Temp:     98.7 degrees F (37.06 degrees C) oral Pulse rate:   76 / minute BP sitting:   119 / 82  (left arm) Cuff size:   regular  Vitals Entered By: Jennet Maduro RN (September 10, 2009 2:06 PM) CC: f/u appt. Is Patient Diabetic? No Pain Assessment Patient in pain? no      Nutritional Status BMI of 25 - 29 = overweight Nutritional Status Detail appetite "good"  Have you ever been in a relationship where you felt threatened, hurt or afraid?not asked with his mother   Does patient need assistance? Functional Status Self care Ambulation Impaired:Risk for fall Comments slight limp    Physical Exam  General:  alert, well-developed, and well-nourished.   Eyes:  vision grossly intact.   Mouth:  pharynx pink and moist and poor dentition.   Neck:  No deformities, masses, or tenderness noted. Lungs:   Normal respiratory effort, chest expands symmetrically. Lungs are clear to auscultation, no crackles or wheezes. Heart:  Normal rate and regular rhythm. S1 and S2 normal without gallop, murmur, click, rub or other extra sounds. Skin:  Intact without suspicious lesions or rashes Cervical Nodes:  No lymphadenopathy noted Axillary Nodes:  No palpable lymphadenopathy   Impression & Recommendations:  Problem # 1:  HIV DISEASE (ICD-042)  The following medications were removed from the medication list:    Septra Ds 800-160 Mg Tabs (Sulfamethoxazole-trimethoprim) ..... One tablet by mouth monday, wed., and friday  Orders: Est. Patient Level IV (04540) Est. Patient Level IV (99214)Future Orders: T-CBC w/Diff ([id number]) ... 01/18/2010 T-CD4SP ([facility]) (CD4SP) ... 01/18/2010 T-Comprehensive Metabolic Panel [phone]) ... 01/18/2010 T-HIV Viral Load [phone]) ... 01/18/2010   Contiue Atripla.   Patient Instructions: 1)  Please schedule a follow-up appointment in 4-5 months. 2)  Be sure to return for lab work one (2) week before your next appointment as scheduled. Prescriptions: HYDROCODONE-ACETAMINOPHEN 5-500 MG TABS (HYDROCODONE-ACETAMINOPHEN) Take 1 tablet by mouth every 6 hours as needed for pain  #120 x 3   Entered and Authorized by:   Lina Sayre MD   Signed by:  Lina Sayre MD on 09/10/2009   Method used:   Telephoned to ...       [facility] [address] #[id number]* (retail)       [address] [place], [place]  [address]       Ph: [phone]       Fax: [phone]   RxID:   [id number] NEXIUM 40 MG PACK (ESOMEPRAZOLE MAGNESIUM) Take 1 capsule by mouth once a day  #30 x prn   Entered by:   Jennet Maduro RN   Authorized by:   Lina Sayre MD   Signed by:   Jennet Maduro RN on 09/10/2009   Method used:   Electronically to        [facility] [address] [facility] [address] #[id number]* (retail)       [address] [place], [place]  [address]       Ph: [phone]       Fax:  [phone]   RxID:   [id number]            Prevention For Positives: 09/10/2009   Safe sex practices discussed with patient. Condoms offered.                            Process Orders Check Orders Results:     [facility]: ABN not required for this insurance Tests Sent for requisitioning (September 10, 2009 2:58 PM):     01/18/2010: [facility] -- T-CBC w/Diff [[id number]] (signed)     01/18/2010: [facility] -- T-Comprehensive Metabolic Panel [[id number]] (signed)     01/18/2010: [facility] -- T-HIV Viral Load [phone] (signed)

## 2010-05-27 ENCOUNTER — Other Ambulatory Visit (INDEPENDENT_AMBULATORY_CARE_PROVIDER_SITE_OTHER): Payer: Medicare Other

## 2010-05-27 ENCOUNTER — Other Ambulatory Visit: Payer: Self-pay | Admitting: Infectious Diseases

## 2010-05-27 ENCOUNTER — Encounter: Payer: Self-pay | Admitting: Infectious Diseases

## 2010-05-27 DIAGNOSIS — B20 Human immunodeficiency virus [HIV] disease: Secondary | ICD-10-CM

## 2010-05-27 LAB — CONVERTED CEMR LAB
ALT: 66 units/L — ABNORMAL HIGH (ref 0–53)
AST: 32 units/L (ref 0–37)
Albumin: 4.3 g/dL (ref 3.5–5.2)
Alkaline Phosphatase: 141 units/L — ABNORMAL HIGH (ref 39–117)
BUN: 18 mg/dL (ref 6–23)
Basophils Absolute: 0.1 10*3/uL (ref 0.0–0.1)
Basophils Relative: 1 % (ref 0–1)
CO2: 23 meq/L (ref 19–32)
Calcium: 9.5 mg/dL (ref 8.4–10.5)
Chloride: 103 meq/L (ref 96–112)
Creatinine, Ser: 1.31 mg/dL (ref 0.40–1.50)
Eosinophils Absolute: 0.1 10*3/uL (ref 0.0–0.7)
Eosinophils Relative: 2 % (ref 0–5)
Glucose, Bld: 85 mg/dL (ref 70–99)
HCT: 42.3 % (ref 39.0–52.0)
HIV 1 RNA Quant: <20 copies/mL (ref <20)
HIV-1 RNA Quant, Log: <1.3 (ref <1.30)
Hemoglobin: 14 g/dL (ref 13.0–17.0)
Lymphocytes Relative: 37 % (ref 12–46)
Lymphs Abs: 2.2 10*3/uL (ref 0.7–4.0)
MCHC: 33.1 g/dL (ref 30.0–36.0)
MCV: 86.9 fL (ref 78.0–100.0)
Monocytes Absolute: 0.4 10*3/uL (ref 0.1–1.0)
Monocytes Relative: 6 % (ref 3–12)
Neutro Abs: 3.3 10*3/uL (ref 1.7–7.7)
Neutrophils Relative %: 55 % (ref 43–77)
Platelets: 295 10*3/uL (ref 150–400)
Potassium: 4 meq/L (ref 3.5–5.3)
RBC: 4.87 M/uL (ref 4.22–5.81)
RDW: 15.5 % (ref 11.5–15.5)
Sodium: 140 meq/L (ref 135–145)
Total Bilirubin: 0.3 mg/dL (ref 0.3–1.2)
Total Protein: 7.4 g/dL (ref 6.0–8.3)
WBC: 6 10*3/uL (ref 4.0–10.5)

## 2010-05-28 LAB — T-HELPER CELL (CD4) - (RCID CLINIC ONLY)
CD4 % Helper T Cell: 26 % — ABNORMAL LOW (ref 33–55)
CD4 T Cell Abs: 550 uL (ref 400–2700)

## 2010-06-10 ENCOUNTER — Ambulatory Visit (INDEPENDENT_AMBULATORY_CARE_PROVIDER_SITE_OTHER): Payer: Medicare Other | Admitting: Infectious Diseases

## 2010-06-10 DIAGNOSIS — B20 Human immunodeficiency virus [HIV] disease: Secondary | ICD-10-CM

## 2010-06-10 DIAGNOSIS — Z23 Encounter for immunization: Secondary | ICD-10-CM

## 2010-06-10 NOTE — Assessment & Plan Note (Signed)
Summary: 5 month f/u [mkj]   CC:  5 month follow up.  Preventive Screening-Counseling & Management  Alcohol-Tobacco     Alcohol drinks/day: 0     Smoking Status: quit     Year Quit: 2006     Pack years: 1/2 ppd  Caffeine-Diet-Exercise     Caffeine use/day: 0     Does Patient Exercise: no     Type of exercise: walking inside the home     Times/week: daily  Safety-Violence-Falls     Seat Belt Use: yes   Current Allergies (reviewed today): No known allergies  Vital Signs:  Patient profile:   45 year old male Height:      64 inches (162.56 cm) Weight:      166.0 pounds (75.45 kg) BMI:     28.60 Temp:     98.2 degrees F (36.78 degrees C) oral Pulse rate:   79 / minute BP sitting:   133 / 81  (right arm)  Vitals Entered By: Baxter Hire) (January 29, 2010 10:04 AM) CC: 5 month follow up Pain Assessment Patient in pain? no      Nutritional Status BMI of 25 - 29 = overweight Nutritional Status Detail appetite is good per patient  Have you ever been in a relationship where you felt threatened, hurt or afraid?Unable to ask   Does patient need assistance? Functional Status Self care Ambulation Normal    Medications Added to Medication List This Visit: 1)  Simvastatin 20 Mg Tabs (Simvastatin) .... Take one tablet by mouth every night  Other Orders: Influenza Vaccine NON MCR ([address]109) Est. Patient Level IV (60454) Future Orders: T-CBC w/Diff ([id number]) ... 06/01/2010 T-CD4SP ([facility]) (CD4SP) ... 06/01/2010 T-Comprehensive Metabolic Panel [id number]) ... 06/01/2010 T-HIV Viral Load [phone]) ... 06/01/2010  Patient Instructions: 1)  Please schedule a follow-up appointment in 4 months. 2)  Be sure to return for lab work one (1) week before your next appointment as scheduled.   Influenza Vaccine    Vaccine Type: Fluvax Non-MCR    Site: right deltoid    Mfr: Novartis    Dose: 0.5 ml    Route: IM    Given by: Kathi Simpers CMA(AAMA)    Exp. Date:  07/04/2010    Lot #: [id number]    VIS given: 10/27/09 version given January 29, 2010.  Flu Vaccine Consent Questions    Do you have a history of severe allergic reactions to this vaccine? no    Any prior history of allergic reactions to egg and/or gelatin? no    Do you have a sensitivity to the preservative Thimersol? no    Do you have a past history of Guillan-Barre Syndrome? no    Do you currently have an acute febrile illness? no    Have you ever had a severe reaction to latex? no    Vaccine information given and explained to patient? yes   Process Orders Check Orders Results:     [facility]: ABN not required for this insurance Tests Sent for requisitioning (June 01, 2010 10:45 AM):     06/01/2010: [facility] -- T-CBC w/Diff [[id number]] (signed)     06/01/2010: [facility] -- T-Comprehensive Metabolic Panel [[id number]] (signed)     06/01/2010: [facility] -- T-HIV Viral Load [phone] (signed)

## 2010-06-16 LAB — T-HELPER CELL (CD4) - (RCID CLINIC ONLY)
CD4 % Helper T Cell: 27 % — ABNORMAL LOW (ref 33–55)
CD4 T Cell Abs: 470 uL (ref 400–2700)

## 2010-06-21 LAB — T-HELPER CELL (CD4) - (RCID CLINIC ONLY)
CD4 % Helper T Cell: 27 % — ABNORMAL LOW (ref 33–55)
CD4 T Cell Abs: 610 uL (ref 400–2700)

## 2010-07-05 ENCOUNTER — Other Ambulatory Visit: Payer: Self-pay | Admitting: Infectious Diseases

## 2010-07-05 DIAGNOSIS — G8929 Other chronic pain: Secondary | ICD-10-CM

## 2010-07-06 ENCOUNTER — Other Ambulatory Visit: Payer: Self-pay | Admitting: Licensed Clinical Social Worker

## 2010-07-09 LAB — T-HELPER CELL (CD4) - (RCID CLINIC ONLY)
CD4 % Helper T Cell: 26 % — ABNORMAL LOW (ref 33–55)
CD4 T Cell Abs: 570 uL (ref 400–2700)

## 2010-08-02 ENCOUNTER — Other Ambulatory Visit: Payer: Self-pay | Admitting: Infectious Diseases

## 2010-08-02 DIAGNOSIS — R52 Pain, unspecified: Secondary | ICD-10-CM
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
## 2010-08-20 NOTE — Discharge Summary (Signed)
NAME:  Isaac Miller, Isaac Miller                ACCOUNT NO.:  [id number]   MEDICAL RECORD NO.:  [id number]          PATIENT TYPE:  INP   LOCATION:  5032                         FACILITY:  [facility]   PHYSICIAN:  Gabrielle Dare. Janee Morn, M.D.DATE OF BIRTH:  12/19/1965   DATE OF ADMISSION:  01/07/2004  DATE OF DISCHARGE:  03/24/2004                                 DISCHARGE SUMMARY   CONSULTATIONS:  1.  Clydene Fake, M.D., neurosurgery.  2.  Dora Sims, M.D., oral maxillofacial surgery.  3.  Leonides Grills, M.D., orthopedics.  4.  Rockey Situ. Roxan Hockey, M.D., infectious disease.   DISCHARGE DIAGNOSES:  1.  Status post jump from a bridge.  2.  Closed head injury with subdural hematoma and subarachnoid hemorrhage.  3.  Large avulsion laceration, left upper forehead.  4.  Left frontal skull fracture.  5.  Bilateral orbital fractures.  6.  Ethmoid sinus fractures.  7.  Transverse process fractures L1 on the left and L3-L4 anterior body      fractures without loss of height.  8.  Small pneumomediastinum.  9.  Right nondisplaced pilon ankle fracture.  10. Right joint depression calcaneus fracture.  11. Nondisplaced left lateral malleolar fracture.  12. Left nondisplaced Hawkins I talar neck body fracture.  13. Ventilator-dependent respiratory failure, resolved.  14. Human immunodeficiency virus positive status.  15. Pneumonia, resolved.  16. Dental fractures.  17. Dysphagia, status post percutaneous gastrostomy tube placement.   PROCEDURES:  1.  Status post left frontal craniotomy with resection of intracranial      hemorrhage and placement of a left frontal ventriculostomy per Dr.      Phoebe Perch, on January 07, 2004.  2.  Esophagogastroduodenoscopy with percutaneous gastrostomy tube placement,      bronchoscopy per Dr. Jimmye Norman on January 16, 2004.   HOSPITAL COURSE:  This is a 45 year old, African-American male who  apparently jumped from a bridge approximately 25 feet up.  He was brought to  [facility] Emergency Room as a Gold trauma.  He was awake and alert, but  confused.  He was complaining of pain all over.  A CT scan of the head was  done and revealed a frontal subdural hematoma and also subarachnoid blood.  He had a frontal skull fracture which fractured into the medial wall of the  left orbit and he also had fractures of the ethmoid sinuses and a fracture  of the right orbital roof.  He had a large avulsion laceration to the left  upper forehead area.  He had a CT scan of his chest which showed a small  amount of pneumomediastinum.  CT scan of the abdomen showed transverse  process fractures on the left at L1 and anterior body fractures at L3-L4  without loss of height.  The patient was intubated as his mental status  declined following his evaluation in the emergency room.  Dr. Phoebe Perch saw the  patient in consultation and took the patient to the OR for a left frontal  craniotomy with evacuation of subdural hematoma and placement of a left  frontal ventriculostomy.  The  patient remained hospitalized in the ICU on  the ventilator following this.  The patient was also evaluated by Dr. Leonie Man for his facial fractures including open frontal sinus fractures.  It  was felt that he should be treated with IV antibiotics secondary to his  sinus fracture and this was initiated following his craniotomy.  The patient  also had bilateral lower extremity fractures including a right nondisplaced  pilon fracture, right joint depression calcaneus fracture, nondisplaced left  lateral malleolar fracture and a nondisplaced talar neck body fracture.  The  fractures were treated nonoperatively with bilateral splinting.  It was  recommended that the patient remain nonweightbearing on bilateral lower  extremities x3 months.  The patient was placed on Dilantin for seizure  activity postoperatively.  He remained in the ICU long-term and was  ventilator-dependent.  The family did not wish to  withdraw support and ICU  care was continued.  His intracranial pressures remained low and his  ventriculostomy was able to be removed fairly quickly.  Eventually, the  patient underwent tracheostomy and percutaneous gastrostomy tube placement  and attempts were made to wean the patient from the ventilator.  This was a  very slow wean.  His course was complicated by pneumonias and positive blood  cultures which were treated appropriately.  The patient did have HIV  positive status and was maintained on HIV drugs following consultation with  the infectious disease physicians.   At this time, the patient was completely weaned off the ventilator.  He is  awake, alert and conversant.  He has not been able to take an oral diet or  oral medications at this time as he continues to exhibit significant  dysphagia.  He continues on his appropriate HIV medications.  He continues  on Dilantin for seizure prophylaxis.  He continues to exhibit increased tone  and has limited functional status.  He is following one-step commands.  He  rolls with maximum assist and he is total assist for supine to sit.  He is  able to sit on the edge of the bed with assistance.  He will require  continued therapies to improve his mobility status.  He does remain strict  nonweightbearing on both lower extremities secondary to his bilateral foot  and ankle fractures.   DISCHARGE MEDICATIONS:  1.  Albuterol nebulizers and Atrovent nebulizers q.6h.  2.  Sustiva 600 mg p.o. q.h.s.  3.  Protonix 40 mg p.o. q.a.m.  4.  Ferrous sulfate 300 mg per tube t.i.d.  5.  Septra suspension 160 mg every Monday, Wednesday and Friday.  6.  Reglan 10 mg per tube q.6h.  7.  Hydrocortisone topical cream to facial rash b.i.d.  8.  Truvada one tablet p.o. q.d.  9.  Multivitamin 5 cc q.d.  10. Klonopin 1 mg per tube q.8h.  11. Seroquel 50 mg per tube q.8h.  12. Diflucan 200 mg per tube q.d. 13. Dilantin 100 mg per tube q.12h.  14. Tylenol  650 mg per tube q.4h. p.r.n. pain.   DIET:  Osmolite 1.5 at 85 cc per hour.  He has been tolerating all of his  tube feedings well.   ACTIVITY:  He should be mobilized with therapy out of bed.  He has been on a  specialty [employer] bed throughout his hospitalization and did have a small  sacral decubitus which is significantly improved by the time of discharge.  He does need turning q.2h.  He is strictly nonweightbearing in bilateral  lower extremities.   FOLLOW UP:  He needs to follow up with Dr. Lestine Box in January 2006, for  reevaluation of his bilateral lower extremity fractures and assessment for  possible weightbearing at this time.  He can followup with trauma services  as needed.   DISPOSITION:  At this time, the patient is being prepared for discharge to  skilled nursing facility and is medically stable for this at this time.       SR/MEDQ  D:  03/24/2004  T:  03/24/2004  Job:  [id number]

## 2010-08-20 NOTE — H&P (Signed)
NAME:  Isaac Miller, Isaac Miller                ACCOUNT NO.:  [id number]   MEDICAL RECORD NO.:  [id number]          PATIENT TYPE:  EMS   LOCATION:  MAJO                         FACILITY:  [facility]   PHYSICIAN:  Ollen Gross. Carolynne Edouard, M.D.     DATE OF BIRTH:  Jun 01, 1965   DATE OF ADMISSION:  01/07/2004  DATE OF DISCHARGE:                                HISTORY & PHYSICAL   CONSULTING PHYSICIANS:  Clydene Fake, M.D., or neurosurgery and Dora Sims, M.D., for oral/maxillofacial.   CHIEF COMPLAINT:  Fall from bridge.   HISTORY OF PRESENT ILLNESS:  This is a 45 year old African-American male who  apparently jumped or stepped off the end of a bridge that was approximately  25-30 feet high.  Witnesses state when he came down, he came down not  landing on his feet.  He was brought to the [facility] emergency room as a  gold trauma.  He was awake and alert but slightly confused as to time.  He  was complaining of pain all over.  Subsequently the patient underwent CT  scan of the head, which revealed a frontal subdural hematoma and also  subarachnoid blood.  He also had a frontal skull fracture which fractured  into the medial wall of the left orbit and also fractures of the ethmoid  sinuses and also fracture of the right orbital roof.  He had a large  avulsion injury to the left upper forehead area.  He had some discolorations  of his ankles.  X-rays were done and were pending at the time of this  dictation.  His CT scan of the chest showed small pneumomediastinum.  CT  scan of the abdomen showed a transverse process fracture on the left at L1  and some anterior body fractures of L3 and L4 without loss of height.  Dr.  Phoebe Perch was consulted.  He saw the patient.  The patient was subsequently  intubated.  The patient's overall level of consciousness had deteriorated  such that intubation was the course of action at the time.   PAST MEDICAL HISTORY:  Unknown at this time.  The patient states he does  smoke  cigarettes.  He denies use of other drugs or ETOH.  Do not know if he  takes any medications at this time.  We do not know if he has any allergies.   REVIEW OF SYSTEMS:  There is no history for any COPD, PND, PUD, __________,  hemoptysis, hematemesis, hematemesis, or asthma.   PHYSICAL EXAMINATION:  GENERAL:  He is a well-developed, well-nourished 89-  year-old African-American male in distress, who is somewhat alert but  lethargic.  Not that communicative.  HEENT:  , EOMI, PERRL.  Large avulsion laceration to the left upper  forehead.  There is swelling of bilateral orbits.  EOMs are intact.  The  pupils were 3 mm and reactive.  Oropharynx was clear.  NECK:  Supple without JVD, lymphadenopathy, thyromegaly.  Trachea is  midline.  C-collar is in place.  CHEST:  Symmetrical on inspiration, clear to auscultation.  CARDIOVASCULAR:  Regular rate and rhythm  without murmur, rub, or gallop.  ABDOMEN:  Soft, bowel sounds were present but decreased.  There are no  palpable pulsatile masses, no HSM.  It was nontender to palpation.  Pelvis  was stable.  GENITOURINARY, RECTAL:  Deferred.  EXTREMITIES:  Without clubbing or cyanosis.  There was some ecchymosis of  his left lower ankle.  There was some swelling, bilateral ankles.  Peripheral pulses were intact.  NEUROLOGIC:  Cranial nerves II-XII initially grossly intact.  There was no  complaint of numbness.  He is moving all extremities on arrival.   IMPRESSION:  1.  Fall.  2.  Closed head injury.  3.  Subdural hematoma.  4.  Subarachnoid hemorrhage.  5.  Large avulsion laceration, left upper forehead.  6.  Left frontal skull fracture.  7.  Bilateral orbital fractures.  8.  Ethmoid sinus fractures.  9.  Transverse process fracture, L1 on left, L3 and L4 anterior body      fractures without loss of height.  10. Small pneumomediastinum with some pneumocephalus, small amount.   PLAN:  The patient will be admitted to 3100 ICU.  He is  intubated at this  time.  A repeat CT scan in the morning.  If possible, a ventriculostomy will  be inserted by Dr. Phoebe Perch, and the patient is also followed by Dr. Monia Pouch for  his other fractures.       CL/MEDQ  D:  01/07/2004  T:  01/07/2004  Job:  [id number]

## 2010-08-20 NOTE — H&P (Signed)
NAME:  Isaac Miller, BEZA NO.:  [id number]   MEDICAL RECORD NO.:  [id number]                   PATIENT TYPE:  IPS   LOCATION:  0403                                 FACILITY:  [facility]   PHYSICIAN:  Geoffery Lyons, M.D.                   DATE OF BIRTH:  [birth year]   DATE OF ADMISSION:  06/06/2002  DATE OF DISCHARGE:  06/11/2002                         PSYCHIATRIC ADMISSION ASSESSMENT   IDENTIFYING INFORMATION:  This is a 45 year old African-American male who is  single, involuntary admission.   HISTORY OF PRESENT ILLNESS:  This with a 29-month history of cocaine  addiction found his sister and told her that he felt hopeless to overcome  his crack addiction and did not want to live any longer.  She found him  reclusive, disheveled and in bed.  He had not been tending to his hygiene,  nutrition, or drinking adequate fluids.  Today, he endorses hopelessness and  helplessness, and he admits that he had had suicidal thoughts, without any  clear plan.  He is just feeling unable to stop smoking crack and felt he had  nothing to live for.  He denies any depression prior to the crack use and  states I was doing really well until I started using.  He denies any  homicidal ideation or auditory or visual hallucinations.   PAST PSYCHIATRIC HISTORY:  The patient has received counseling through the  [facility] but has no current psychiatric Ladeidra Borys.  This is his  first admission to [facility].  He has a history  of one prior psychiatric admission in 1996 to the [facility] unit for a  suicide attempt, after he became divorced.  That is his only other  psychiatric admission.   SOCIAL HISTORY:  The patient has a high school education.  He was raised in  [place], [place], moved to [place] 12 years ago to live near  family.  He is never married, no children.  He currently lives alone,  although he has had a same-sex partner in the  past.  He is disabled because  of his HIV and visual deficits.   FAMILY HISTORY:  The patient denies any family history of substance abuse or  mental illness.   ALCOHOL AND DRUG HISTORY:  The patient has abused cannabis for many years  and reports that he just started abusing cocaine approximately 4 months ago  for the first time.   PAST MEDICAL HISTORY:  Primary care Tiffanyann Deroo is [facility]  Medical  problems are HIV positive.   MEDICATIONS:  Include several anti-virals for HIV, including Viread 300 mg  p.o. daily, Sustiva 600 mg daily, and Epivir 300 mg p.o. daily.   DRUG ALLERGIES:  None.   POSITIVE PHYSICAL FINDINGS:  The patient's physical examination was done in  the emergency room and is documented here in the record.  It was done at  [facility] Emergency Department.  Laboratory studies are generally all  within normal limits.  His urine drug screen was positive for cocaine and  marijuana.  On admission to the unit, the patient's vital signs were within  normal limits.  He is fully alert and cooperative.   MENTAL STATUS EXAM:  This is a small-build, African-American male who wears  very thick glasses and generally has a normal motor exam, good balance,  focus and concentration.  He is cooperative.  His affect is fairly bright  and appropriate to the situation.  He is pleasant, tracks conversation well,  and has good concentration.  Speech is normal.  He is articulate.  Mood is  depressed and frustrated and ashamed of his addiction, feels that he will  not be able to overcome this alone, although today now that he is here he  states he feels much safer and has begun to evaluate both positive and  negative factors in his life.  Thought process is remarkable for being  logical and goal oriented.  He continues to have some suicidal ideation,  being ashamed of himself and feeling under the influence of cocaine to drive  his motivations.  He has no specific plan for  suicide, no evidence of  homicidal ideation, no psychosis.  Cognitively he is intact and oriented x3.   ADMISSION DIAGNOSES:   AXIS I:  1. Major depressive disorder, recurrent, severe versus substance-induced     mood disorder.  2. Cocaine abuse rule out dependence.  3. Cannabis abuse.   AXIS II:  No diagnosis.   AXIS III:  Human immunodeficiency virus.   AXIS IV:  Severe problems with substance abuse and becoming alienated from  friends and supportive others because of this.   AXIS V:  Current 39, past year 2.   INITIAL PLAN OF CARE:  Involuntarily admit the patient with q.15 minute  checks in place.  We will place him on Ensure supplements.  We discussed the  possibility of antidepressants and the role that they might play in his  illness, but the patient would like to avoid taking these now and we are in  agreement with that at this time.  Meanwhile, we are going to start  Symmetrel 100 mg p.o. b.i.d. because of his cocaine cravings.  We have also  placed him on trazodone 50 mg at h.s. on a p.r.n. basis for insomnia.  He  declines STD screening and feels that he does not have need of this at this  time.   ESTIMATED LENGTH OF STAY:  4 days.     Margaret A. Scott, N.P.                   Geoffery Lyons, M.D.    MAS/MEDQ  D:  07/29/2002  T:  07/30/2002  Job:  [id number]

## 2010-08-20 NOTE — Consult Note (Signed)
NAME:  Isaac Miller, Isaac Miller NO.:  [id number]   MEDICAL RECORD NO.:  [id number]          PATIENT TYPE:  INP   LOCATION:  1828                         FACILITY:  [facility]   PHYSICIAN:  Dora Sims, M.D.  DATE OF BIRTH:  Feb 09, 1966   DATE OF CONSULTATION:  01/07/2004  DATE OF DISCHARGE:                                   CONSULTATION   I was initially consulted by the trauma service specifically, Isaac Miller the PA,  for evaluation of facial fractures of a patient, Isaac Miller, in the  emergency department.  This is a 45 year old black gentleman.  History of  present illness is this gentleman either fell or jumped off a bridge earlier  today.  By report, the patient was conscious and verbal on initial  presentation to the emergency department, however, after he had CT scan, he  was found to have a closed head injury and became obtunded.  The anesthesia  service, Dr. Adonis Huguenin, was intubating the patient in the emergency  department at the time of my arrival.   PHYSICAL EXAMINATION:  On physical examination, the patient was wearing a  head bandage covering an avulsive skin injury over the forehead area which  was consistent with frontal sinus fractures that will be addressed later in  the report.  Also, by report, his pupils equal, round, reactive to light by  the emergency department staff and trauma team prior to his going to the CT  scan.  Both upper and lower eyelids are grossly edematous and ecchymotic,  both of them are swollen closed.  The nasal bones, maxilla, and mandible  appear to be intact and are stable.   Head CT was evaluated by me and seemed to have an intracranial bleed which  neurosurgeon, Dr. Orbie Hurst, has been consulted on and is managing.  There  is a left frontal sinus fracture of both inner and outer tables lying  underneath the avulsive skin injury.  There is also supraorbital wall  fractures extending from the frontal sinus fractures.  The frontal  and  ethmoid sinuses do have air fluid levels and there are some inconsequential  factors of the orbital walls that do not need treatment.  Also significant,  on CT scan is he is positive for pneumocephalus posterior to the left  frontal sinus fracture.   ASSESSMENT:  54.  45 year old black gentleman status post high fall off a bridge.  2.  Decrease in mental status in the emergency department and is intubated      which was likely the result of a closed head injury intracranial bleed.  3.  The only facial fractures that need attention are the open frontal sinus      fractures and avulsive skin injury over these fractures of the inner and      outer tables.   PLAN:  1.  IV antibiotics, choice to be made by the neurosurgery team.  2.  I will follow and discuss treatment plan options of the frontal sinus      once cerebral status is stable and the patient is OK  to tolerate a      procedure for the avulsive injury as well as frontal sinus fractures.      Robe   RJR/MEDQ  D:  01/07/2004  T:  01/07/2004  Job:  [id number]

## 2010-08-20 NOTE — Consult Note (Signed)
NAME:  Isaac Miller, Isaac Miller NO.:  [id number]   MEDICAL RECORD NO.:  [id number]          PATIENT TYPE:  INP   LOCATION:  3107                         FACILITY:  [facility]   PHYSICIAN:  Leonides Grills, M.D.     DATE OF BIRTH:  [birth year]   DATE OF CONSULTATION:  01/07/2004  DATE OF DISCHARGE:                                   CONSULTATION   HISTORY OF PRESENT ILLNESS:  This is a 45 year old male who presented to  [facility] ER as a Gold trauma.  He was unconscious and  had bilateral ankle swelling.  He had a closed head injury and required  intubation.  After x-rays were obtained, he was found to have multiple  fractures in bilateral ankles and I was consulted for further evaluation and  treatment.   PHYSICAL EXAMINATION:  GENERAL:  He was intubated.  EXTREMITIES:  Compartments are soft in the leg and foot bilaterally.  He  does have swelling of bilateral ankles.   LABORATORY DATA:  X-rays of bilateral ankles reveal what appears to be a  nondisplaced left lateral malleolus fracture, left possible talar neck body  fracture, left calcaneus fracture, and possible right tibia fracture.  CT  scans were evaluated of both bilateral lower extremities of the ankle and  show a nondisplaced anterolateral tibial plafond fracture, right joint  depression comminuted calcaneus fracture, left nondisplaced lateral  malleolus fracture, left nondisplaced talar body neck fracture Juanetta Gosling I).   IMPRESSION:  1.  Right nondisplaced pylon fracture.  2.  Right joint depression calcaneus fracture.  3.  Nondisplaced left lateral malleolus fracture.  4.  Left nondisplaced Hawkins I talar neck body fracture.   PLAN:  We will treat all four fractures nonoperatively.  He currently is in  a life-threatening situation in regards to his closed head injury and is  going to be taken to the OR.  We will follow him along.  We will apply  bilateral Jones dressing with a posterior molded  splint.  He is to keep  these elevated.  Once he gains more consciousness, we will start early range  of motion exercises of the right ankle and hindfoot and for the left lower  extremity we will place him a short-leg nonweightbearing cast.  He will  remain nonweightbearing for three months bilaterally.       PB/MEDQ  D:  01/07/2004  T:  01/07/2004  Job:  [id number]

## 2010-08-20 NOTE — Discharge Summary (Signed)
NAME:  Isaac Miller, Isaac Miller NO.:  [id number]   MEDICAL RECORD NO.:  [id number]                   PATIENT TYPE:  IPS   LOCATION:  0403                                 FACILITY:  [facility]   PHYSICIAN:  Geoffery Lyons, M.D.                   DATE OF BIRTH:  05-11-1965   DATE OF ADMISSION:  06/06/2002  DATE OF DISCHARGE:  06/11/2002                                 DISCHARGE SUMMARY   CHIEF COMPLAINT AND PRESENT ILLNESS:  This was the first admission to [facility] for this 45 year old African-American male, single,  involuntarily committed.  Former history of cocaine addiction.  Phoned her  sister and told her that he felt hopeless to overcome crack addiction.  Did  not want to live.  Found him reclusive, disheveled, in bed, not attending to  his ADLs.  Hopeless, helpless, suicidal ideation without a plan.  Been  unable to stop smoking crack.  No depression prior to crack use.   PAST PSYCHIATRIC HISTORY:  Has seen counseling at [facility].  First time [facility].  [facility] for suicide attempt after divorce.   ALCOHOL/DRUG HISTORY:  Marijuana for many years.  Cocaine for four months.   PAST MEDICAL HISTORY:  Positive for HIV.   MEDICATIONS:  Antiviral for HIV, Viread 300 mg every day, Sustiva 600 mg  daily, Epivir 300 mg daily.   PHYSICAL EXAMINATION:  Performed and failed to show any acute findings.   LABORATORY DATA:  UDS was positive for cocaine and marijuana.   MENTAL STATUS EXAM:  Small-built male.  Cooperative.  Fairly bright affect.  Appropriate.  Speech normal, articulate.  Mood depressed, frustrated with  the addiction but feeling hopeful at the time of this evaluation.  Positive  for suicidal ideation without a plan.  No homicidal ideation.  Cognition  well-preserved.   ADMISSION DATE:   AXIS I:  1. Marijuana dependence.  2. Cocaine abuse.  3. Substance-induced mood disorder.   AXIS II:  No diagnosis.   AXIS  III:  Human immunodeficiency virus positive.   AXIS IV:  Moderate.   AXIS V:  Global Assessment of Functioning upon admission 39; highest Global  Assessment of Functioning in the last year 70.   LABORATORY DATA:  Thyroid profile was within normal limits.   HOSPITAL COURSE:  He was admitted and started intensive individual and group  psychotherapy.  He was given some Ambien for sleep.  He was maintained on  Sustiva 600 mg daily, Viread 300 mg daily, Epivir 150 mg, 2 every day, Paxil  CR 12.5 mg daily, Ensure, MVI, Ativan as needed for anxiety.  Eventually,  the Paxil was discontinued.  He was placed on Symmetrel 100 mg twice a day  and Paxil was increased to 25 mg per day.  Initially, he endorsed that his  life was completely out of control, dealing  with the cocaine.  He got  hooked, was aware of the effect on the immune system and the fact that  things can be worse if he continues to use.  Mood depressed, anxious.  Affect was anxious, feeling very overwhelmed, suicidal ruminations but can  contract for safety.  As the hospitalization progressed, he started claiming  that he was feeling better.  Still anxious and craving but working on a  relapse prevention plan, where he felt that he could not see himself going  back to using as he was aware of the consequences.  Continued to evidence  motivation to abstain.  The mood continued to improve.  There were no active  suicidal ideation.  On June 11, 2002, he was in full contact with reality,  increased insight to abstain as far as also the effect of the drugs on the  immune system.  No suicidal ideation.  No homicidal ideation.  He will be  moving out from where he was staying, as this was a place with a lot of  substance abuse.  So he was much improved.  Mood euthymic.  Affect bright,  broad.  Had a relapse prevention plan in place.   DISCHARGE DIAGNOSES:   AXIS I:  1. Cocaine abuse.  2. Marijuana dependence.  3. Substance-induced mood  disorder.   AXIS II:  No diagnosis.   AXIS III:  Human immunodeficiency virus positive.   AXIS IV:  Moderate.   AXIS V:  Global Assessment of Functioning upon discharge 50.   DISCHARGE MEDICATIONS:  1. Viread 300 mg daily.  2. Epivir 150 mg daily.  3. Sustiva 200 mg, 3 daily.  4. Symmetrel 100 mg twice a day.  5. Paxil CR 25 mg daily.  6. Ambien 10 mg at bedtime for sleep.   FOLLOW UP:  [facility]                                               Geoffery Lyons, M.D.    IL/MEDQ  D:  07/09/2002  T:  07/09/2002  Job:  [id number]

## 2010-09-01 ENCOUNTER — Ambulatory Visit: Payer: Medicare Other

## 2010-09-02 ENCOUNTER — Telehealth: Payer: Self-pay | Admitting: *Deleted

## 2010-09-02 ENCOUNTER — Encounter: Payer: Self-pay | Admitting: Infectious Diseases

## 2010-09-02 ENCOUNTER — Ambulatory Visit: Payer: Medicare Other

## 2010-09-02 NOTE — Telephone Encounter (Signed)
Per front office a woman came in asking for his refill on Atripla & hydrocodone. Called pharmacy. He has refill on hydrocodone that was ready for pick up. I gave them the atripla refill over the phone. When I went to front to tell her this had been taken care of she had gone. Will let her know if she comes back or calls

## 2010-09-03 ENCOUNTER — Other Ambulatory Visit: Payer: Self-pay | Admitting: *Deleted

## 2010-09-03 DIAGNOSIS — B20 Human immunodeficiency virus [HIV] disease: Secondary | ICD-10-CM

## 2010-09-03 MED ORDER — EFAVIRENZ-EMTRICITAB-TENOFOVIR 600-200-300 MG PO TABS: 1.0000 | ORAL_TABLET | Freq: Every day | ORAL | Status: DC

## 2010-10-01 ENCOUNTER — Other Ambulatory Visit: Payer: Self-pay | Admitting: Infectious Diseases

## 2010-10-08 ENCOUNTER — Telehealth: Payer: Self-pay | Admitting: *Deleted

## 2010-10-08 DIAGNOSIS — Z113 Encounter for screening for infections with a predominantly sexual mode of transmission: Secondary | ICD-10-CM | POA: Insufficient documentation

## 2010-10-08 DIAGNOSIS — Z5181 Encounter for therapeutic drug level monitoring: Secondary | ICD-10-CM | POA: Insufficient documentation

## 2010-10-08 NOTE — Telephone Encounter (Signed)
Pt obtained labs recently at [facility].  Mother not wanting labs duplicated when pt comes for labs on 10/21/10.  Mother will bring in lab results from [facility] for Dr. Maurice March to review.  RN only put in only those labs needed for Dr. Maurice March.  Jennet Maduro, RN.

## 2010-10-21 ENCOUNTER — Other Ambulatory Visit: Payer: Medicare Other

## 2010-10-21 DIAGNOSIS — B20 Human immunodeficiency virus [HIV] disease: Secondary | ICD-10-CM

## 2010-10-21 DIAGNOSIS — Z113 Encounter for screening for infections with a predominantly sexual mode of transmission: Secondary | ICD-10-CM

## 2010-10-21 DIAGNOSIS — Z79899 Other long term (current) drug therapy: Secondary | ICD-10-CM

## 2010-10-21 LAB — CBC WITH DIFFERENTIAL/PLATELET
Basophils Absolute: 0 10*3/uL (ref 0.0–0.1)
Basophils Relative: 1 % (ref 0–1)
Eosinophils Relative: 2 % (ref 0–5)
HCT: 40.7 % (ref 39.0–52.0)
MCHC: 32.9 g/dL (ref 30.0–36.0)
MCV: 86.6 fL (ref 78.0–100.0)
Monocytes Absolute: 0.3 10*3/uL (ref 0.1–1.0)
Neutro Abs: 3.3 10*3/uL (ref 1.7–7.7)
RDW: 16.8 % — ABNORMAL HIGH (ref 11.5–15.5)

## 2010-10-22 LAB — COMPLETE METABOLIC PANEL WITH GFR
AST: 23 U/L (ref 0–37)
Alkaline Phosphatase: 131 U/L — ABNORMAL HIGH (ref 39–117)
BUN: 15 mg/dL (ref 6–23)
Creat: 1.22 mg/dL (ref 0.50–1.35)

## 2010-10-22 LAB — LIPID PANEL
Cholesterol: 146 mg/dL (ref 0–200)
Total CHOL/HDL Ratio: 3.1 Ratio
Triglycerides: 69 mg/dL (ref <150)
VLDL: 14 mg/dL (ref 0–40)

## 2010-10-22 LAB — HIV-1 RNA QUANT-NO REFLEX-BLD: HIV 1 RNA Quant: <20 copies/mL (ref <20)

## 2010-10-22 LAB — RPR

## 2010-11-04 ENCOUNTER — Ambulatory Visit (INDEPENDENT_AMBULATORY_CARE_PROVIDER_SITE_OTHER): Payer: Medicare Other | Admitting: Infectious Diseases

## 2010-11-04 ENCOUNTER — Encounter: Payer: Self-pay | Admitting: Infectious Diseases

## 2010-11-04 DIAGNOSIS — Z113 Encounter for screening for infections with a predominantly sexual mode of transmission: Secondary | ICD-10-CM

## 2010-11-04 DIAGNOSIS — Z79899 Other long term (current) drug therapy: Secondary | ICD-10-CM

## 2010-11-04 DIAGNOSIS — Z23 Encounter for immunization: Secondary | ICD-10-CM

## 2010-11-04 DIAGNOSIS — B20 Human immunodeficiency virus [HIV] disease: Secondary | ICD-10-CM

## 2010-11-04 NOTE — Progress Notes (Signed)
Macaulay has felt well and is more independent and lives with his mother. He has not complaints but probably because of his activity he has lost about 7 pounds. His HIV is suppressed and CD4 count is >500. Exam shows normal breath sounds, no adenopathy, and no murmurs or gallops. I will f/u in 4-5 months.

## 2010-11-08 ENCOUNTER — Other Ambulatory Visit: Payer: Self-pay | Admitting: Infectious Diseases

## 2010-11-08 DIAGNOSIS — K219 Gastro-esophageal reflux disease without esophagitis: Secondary | ICD-10-CM

## 2010-11-08 DIAGNOSIS — R52 Pain, unspecified: Secondary | ICD-10-CM

## 2010-11-25 ENCOUNTER — Ambulatory Visit: Payer: Medicare Other

## 2010-12-08 ENCOUNTER — Ambulatory Visit: Payer: Medicare Other

## 2010-12-14 ENCOUNTER — Other Ambulatory Visit: Payer: Self-pay | Admitting: Infectious Diseases

## 2010-12-15 ENCOUNTER — Other Ambulatory Visit: Payer: Self-pay | Admitting: Infectious Diseases

## 2010-12-20 ENCOUNTER — Other Ambulatory Visit: Payer: Self-pay | Admitting: *Deleted

## 2010-12-20 DIAGNOSIS — E7800 Pure hypercholesterolemia, unspecified: Secondary | ICD-10-CM

## 2010-12-20 DIAGNOSIS — F329 Major depressive disorder, single episode, unspecified: Secondary | ICD-10-CM

## 2010-12-20 DIAGNOSIS — R52 Pain, unspecified: Secondary | ICD-10-CM

## 2010-12-20 DIAGNOSIS — F419 Anxiety disorder, unspecified: Secondary | ICD-10-CM

## 2010-12-20 MED ORDER — HYDROCODONE-ACETAMINOPHEN 5-500 MG PO TABS: 1.0000 | ORAL_TABLET | Freq: Four times a day (QID) | ORAL | Status: DC | PRN

## 2010-12-20 MED ORDER — CITALOPRAM HYDROBROMIDE 40 MG PO TABS: 40.0000 mg | ORAL_TABLET | Freq: Every day | ORAL | Status: DC | PRN

## 2010-12-20 MED ORDER — SIMVASTATIN 20 MG PO TABS: 20.0000 mg | ORAL_TABLET | Freq: Every day | ORAL | Status: DC

## 2010-12-20 NOTE — Telephone Encounter (Signed)
Pt's mother wanting to know whether the refills have been completed.  All requested refills are completed and should be ready later this afternoon.  Jennet Maduro, RN

## 2010-12-27 ENCOUNTER — Telehealth: Payer: Self-pay | Admitting: *Deleted

## 2010-12-27 ENCOUNTER — Inpatient Hospital Stay (INDEPENDENT_AMBULATORY_CARE_PROVIDER_SITE_OTHER)
Admission: RE | Admit: 2010-12-27 | Discharge: 2010-12-27 | Disposition: A | Discharge status: Home / Self Care | Payer: Medicare Other | Source: Ambulatory Visit | Attending: Family Medicine | Admitting: Family Medicine

## 2010-12-27 DIAGNOSIS — J069 Acute upper respiratory infection, unspecified: Secondary | ICD-10-CM

## 2010-12-27 NOTE — Telephone Encounter (Signed)
Talked with pt's mother.  [facility] nurse practitioner not available to see pts.  RN advised taking the pt to the [facility] on [address].  Pt's mother verbalized understanding and stated that she would take him there.  Jennet Maduro, RN

## 2011-01-13 LAB — T-HELPER CELL (CD4) - (RCID CLINIC ONLY): CD4 T Cell Abs: 600

## 2011-02-08 ENCOUNTER — Other Ambulatory Visit: Payer: Self-pay | Admitting: Infectious Diseases

## 2011-02-08 DIAGNOSIS — F329 Major depressive disorder, single episode, unspecified: Secondary | ICD-10-CM

## 2011-03-03 ENCOUNTER — Other Ambulatory Visit: Payer: Self-pay | Admitting: Infectious Diseases

## 2011-03-09 ENCOUNTER — Other Ambulatory Visit: Payer: Self-pay | Admitting: Infectious Diseases

## 2011-03-09 ENCOUNTER — Other Ambulatory Visit (INDEPENDENT_AMBULATORY_CARE_PROVIDER_SITE_OTHER): Payer: Medicare Other

## 2011-03-09 DIAGNOSIS — Z79899 Other long term (current) drug therapy: Secondary | ICD-10-CM

## 2011-03-09 DIAGNOSIS — Z113 Encounter for screening for infections with a predominantly sexual mode of transmission: Secondary | ICD-10-CM

## 2011-03-09 DIAGNOSIS — B20 Human immunodeficiency virus [HIV] disease: Secondary | ICD-10-CM

## 2011-03-09 LAB — CBC WITH DIFFERENTIAL/PLATELET
Basophils Absolute: 0 10*3/uL (ref 0.0–0.1)
Basophils Relative: 0 % (ref 0–1)
Eosinophils Absolute: 0.1 10*3/uL (ref 0.0–0.7)
Eosinophils Relative: 2 % (ref 0–5)
Lymphs Abs: 2 10*3/uL (ref 0.7–4.0)
MCH: 28.1 pg (ref 26.0–34.0)
MCHC: 32.6 g/dL (ref 30.0–36.0)
MCV: 86.1 fL (ref 78.0–100.0)
Neutrophils Relative %: 55 % (ref 43–77)
Platelets: 272 10*3/uL (ref 150–400)
RBC: 4.74 MIL/uL (ref 4.22–5.81)
RDW: 15.2 % (ref 11.5–15.5)

## 2011-03-09 LAB — LIPID PANEL
HDL: 42 mg/dL (ref >39)
LDL Cholesterol: 95 mg/dL (ref 0–99)

## 2011-03-09 LAB — COMPREHENSIVE METABOLIC PANEL
ALT: 75 U/L — ABNORMAL HIGH (ref 0–53)
AST: 34 U/L (ref 0–37)
Alkaline Phosphatase: 158 U/L — ABNORMAL HIGH (ref 39–117)
Creat: 1.06 mg/dL (ref 0.50–1.35)
Sodium: 139 mEq/L (ref 135–145)
Total Bilirubin: 0.3 mg/dL (ref 0.3–1.2)
Total Protein: 7.3 g/dL (ref 6.0–8.3)

## 2011-03-09 LAB — RPR

## 2011-03-10 ENCOUNTER — Other Ambulatory Visit: Payer: Medicare Other

## 2011-03-10 LAB — T-HELPER CELL (CD4) - (RCID CLINIC ONLY)
CD4 % Helper T Cell: 27 % — ABNORMAL LOW (ref 33–55)
CD4 T Cell Abs: 560 uL (ref 400–2700)

## 2011-03-11 LAB — HIV-1 RNA QUANT-NO REFLEX-BLD
HIV 1 RNA Quant: <20 copies/mL (ref <20)
HIV-1 RNA Quant, Log: <1.3 {Log} (ref <1.30)

## 2011-03-22 ENCOUNTER — Other Ambulatory Visit: Payer: Self-pay | Admitting: Infectious Diseases

## 2011-03-24 ENCOUNTER — Ambulatory Visit: Payer: Medicare Other | Admitting: Infectious Diseases

## 2011-03-24 VITALS — BP 126/84 | HR 89 | Temp 97.7°F | Ht 64.0 in | Wt 163.8 lb

## 2011-03-24 DIAGNOSIS — K0889 Other specified disorders of teeth and supporting structures: Secondary | ICD-10-CM

## 2011-03-24 MED ORDER — HYDROCODONE-ACETAMINOPHEN 5-325 MG PO TABS: 1.0000 | ORAL_TABLET | Freq: Four times a day (QID) | ORAL | Status: AC | PRN

## 2011-04-05 HISTORY — PX: OTHER SURGICAL HISTORY: SHX169

## 2011-04-15 ENCOUNTER — Ambulatory Visit: Payer: Medicare Other

## 2011-04-20 ENCOUNTER — Other Ambulatory Visit: Payer: Self-pay | Admitting: Infectious Diseases

## 2011-04-20 DIAGNOSIS — B20 Human immunodeficiency virus [HIV] disease: Secondary | ICD-10-CM

## 2011-06-09 DIAGNOSIS — R7989 Other specified abnormal findings of blood chemistry: Secondary | ICD-10-CM | POA: Diagnosis not present

## 2011-06-09 DIAGNOSIS — I10 Essential (primary) hypertension: Secondary | ICD-10-CM | POA: Diagnosis not present

## 2011-06-09 DIAGNOSIS — E785 Hyperlipidemia, unspecified: Secondary | ICD-10-CM | POA: Diagnosis not present

## 2011-06-20 ENCOUNTER — Other Ambulatory Visit: Payer: Self-pay | Admitting: Licensed Clinical Social Worker

## 2011-06-20 DIAGNOSIS — B20 Human immunodeficiency virus [HIV] disease: Secondary | ICD-10-CM

## 2011-06-28 ENCOUNTER — Other Ambulatory Visit: Payer: Self-pay | Admitting: Infectious Diseases

## 2011-06-28 DIAGNOSIS — E7800 Pure hypercholesterolemia, unspecified: Secondary | ICD-10-CM

## 2011-07-14 ENCOUNTER — Other Ambulatory Visit: Payer: Medicare Other

## 2011-07-14 DIAGNOSIS — B20 Human immunodeficiency virus [HIV] disease: Secondary | ICD-10-CM

## 2011-07-14 LAB — COMPREHENSIVE METABOLIC PANEL
ALT: 64 U/L — ABNORMAL HIGH (ref 0–53)
BUN: 17 mg/dL (ref 6–23)
CO2: 24 mEq/L (ref 19–32)
Calcium: 8.9 mg/dL (ref 8.4–10.5)
Chloride: 109 mEq/L (ref 96–112)
Creat: 1.13 mg/dL (ref 0.50–1.35)

## 2011-07-14 LAB — CBC WITH DIFFERENTIAL/PLATELET
Eosinophils Relative: 1 % (ref 0–5)
HCT: 41.7 % (ref 39.0–52.0)
Lymphocytes Relative: 33 % (ref 12–46)
Lymphs Abs: 1.6 10*3/uL (ref 0.7–4.0)
MCV: 86.9 fL (ref 78.0–100.0)
Monocytes Absolute: 0.3 10*3/uL (ref 0.1–1.0)
Monocytes Relative: 6 % (ref 3–12)
RBC: 4.8 MIL/uL (ref 4.22–5.81)
WBC: 4.9 10*3/uL (ref 4.0–10.5)

## 2011-07-15 LAB — T-HELPER CELL (CD4) - (RCID CLINIC ONLY)
CD4 % Helper T Cell: 27 % — ABNORMAL LOW (ref 33–55)
CD4 T Cell Abs: 460 uL (ref 400–2700)

## 2011-07-18 LAB — HIV-1 RNA QUANT-NO REFLEX-BLD: HIV 1 RNA Quant: <20 copies/mL (ref <20)

## 2011-07-25 ENCOUNTER — Other Ambulatory Visit: Payer: Self-pay | Admitting: Infectious Diseases

## 2011-07-28 DIAGNOSIS — E785 Hyperlipidemia, unspecified: Secondary | ICD-10-CM | POA: Diagnosis not present

## 2011-07-28 DIAGNOSIS — F3289 Other specified depressive episodes: Secondary | ICD-10-CM | POA: Diagnosis not present

## 2011-07-28 DIAGNOSIS — F71 Moderate intellectual disabilities: Secondary | ICD-10-CM | POA: Diagnosis not present

## 2011-07-28 DIAGNOSIS — B20 Human immunodeficiency virus [HIV] disease: Secondary | ICD-10-CM | POA: Diagnosis not present

## 2011-07-28 DIAGNOSIS — F329 Major depressive disorder, single episode, unspecified: Secondary | ICD-10-CM | POA: Diagnosis not present

## 2011-08-04 ENCOUNTER — Ambulatory Visit (INDEPENDENT_AMBULATORY_CARE_PROVIDER_SITE_OTHER): Payer: Medicare Other | Admitting: Infectious Diseases

## 2011-08-04 ENCOUNTER — Encounter: Payer: Self-pay | Admitting: Infectious Diseases

## 2011-08-04 VITALS — BP 117/78 | HR 84 | Temp 98.0°F | Ht 64.5 in | Wt 161.5 lb

## 2011-08-04 DIAGNOSIS — Z113 Encounter for screening for infections with a predominantly sexual mode of transmission: Secondary | ICD-10-CM

## 2011-08-04 DIAGNOSIS — B20 Human immunodeficiency virus [HIV] disease: Secondary | ICD-10-CM

## 2011-08-04 DIAGNOSIS — Z79899 Other long term (current) drug therapy: Secondary | ICD-10-CM

## 2011-08-04 NOTE — Progress Notes (Signed)
Patient ID: Isaac Miller, male   DOB: [birth year], 46 y.o.   MRN: [id number] [name] is here for f/u of his HIV infection. He has no complaints and his mother who is his caretaker is here with him and verifies his responses. He has had no fevers, weight loss, or diarrhea. His PCP has restarted simivistatin because of elevated LDL his mother tells me. He continues on Atripla w/o problems. His CD4 is .600 and HIV undetectable. His LFTs are slightly elevated and likely from asymptomatic steatohepatitis.  Exam: BP 117/78  Pulse 84  Temp(Src) 98 F (36.7 C) (Oral)  Ht 5' 4.5" (1.638 m)  Wt 161 lb 8 oz (73.256 kg)  BMI 27.29 kg/m2  No rashes. No adenopathy. Lungs clear to auscultation COR No murmurs or rubs.  Assessment: Suppressed HIV and will continue Atripla. F/u routine in 5 months. Lina Sayre

## 2011-10-21 ENCOUNTER — Other Ambulatory Visit: Payer: Self-pay | Admitting: Infectious Diseases

## 2011-10-24 ENCOUNTER — Other Ambulatory Visit: Payer: Self-pay | Admitting: Infectious Diseases

## 2011-11-10 ENCOUNTER — Ambulatory Visit: Payer: Medicare Other

## 2011-11-28 ENCOUNTER — Other Ambulatory Visit: Payer: Self-pay | Admitting: Infectious Diseases

## 2011-12-20 ENCOUNTER — Other Ambulatory Visit: Payer: Medicare Other

## 2011-12-20 DIAGNOSIS — Z113 Encounter for screening for infections with a predominantly sexual mode of transmission: Secondary | ICD-10-CM

## 2011-12-20 DIAGNOSIS — B20 Human immunodeficiency virus [HIV] disease: Secondary | ICD-10-CM | POA: Diagnosis not present

## 2011-12-20 DIAGNOSIS — Z79899 Other long term (current) drug therapy: Secondary | ICD-10-CM | POA: Diagnosis not present

## 2011-12-20 LAB — CBC WITH DIFFERENTIAL/PLATELET
Basophils Relative: 1 % (ref 0–1)
Eosinophils Absolute: 0 10*3/uL (ref 0.0–0.7)
MCH: 27.4 pg (ref 26.0–34.0)
MCHC: 33.8 g/dL (ref 30.0–36.0)
Neutrophils Relative %: 67 % (ref 43–77)
Platelets: 394 10*3/uL (ref 150–400)

## 2011-12-20 LAB — COMPREHENSIVE METABOLIC PANEL
ALT: 61 U/L — ABNORMAL HIGH (ref 0–53)
Alkaline Phosphatase: 153 U/L — ABNORMAL HIGH (ref 39–117)
Glucose, Bld: 87 mg/dL (ref 70–99)
Sodium: 137 mEq/L (ref 135–145)
Total Bilirubin: 0.3 mg/dL (ref 0.3–1.2)
Total Protein: 7.4 g/dL (ref 6.0–8.3)

## 2011-12-20 LAB — LIPID PANEL
Cholesterol: 158 mg/dL (ref 0–200)
Triglycerides: 61 mg/dL (ref <150)

## 2011-12-21 LAB — T-HELPER CELL (CD4) - (RCID CLINIC ONLY): CD4 % Helper T Cell: 28 % — ABNORMAL LOW (ref 33–55)

## 2011-12-21 LAB — HIV-1 RNA QUANT-NO REFLEX-BLD: HIV-1 RNA Quant, Log: <1.3 {Log} (ref <1.30)

## 2011-12-21 LAB — RPR

## 2011-12-22 ENCOUNTER — Other Ambulatory Visit: Payer: Medicare Other

## 2012-01-04 ENCOUNTER — Ambulatory Visit: Payer: Medicare Other | Admitting: Infectious Diseases

## 2012-01-04 ENCOUNTER — Ambulatory Visit (INDEPENDENT_AMBULATORY_CARE_PROVIDER_SITE_OTHER): Payer: Medicare Other | Admitting: Infectious Diseases

## 2012-01-04 ENCOUNTER — Encounter: Payer: Self-pay | Admitting: Infectious Diseases

## 2012-01-04 VITALS — BP 108/76 | HR 80 | Temp 97.9°F | Wt 161.0 lb

## 2012-01-04 DIAGNOSIS — Z23 Encounter for immunization: Secondary | ICD-10-CM | POA: Diagnosis not present

## 2012-01-04 DIAGNOSIS — B20 Human immunodeficiency virus [HIV] disease: Secondary | ICD-10-CM
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
## 2012-01-06 ENCOUNTER — Ambulatory Visit: Payer: Medicare Other | Admitting: Infectious Diseases

## 2012-01-19 DIAGNOSIS — E785 Hyperlipidemia, unspecified: Secondary | ICD-10-CM | POA: Diagnosis not present

## 2012-01-19 DIAGNOSIS — R7989 Other specified abnormal findings of blood chemistry: Secondary | ICD-10-CM | POA: Diagnosis not present

## 2012-01-19 DIAGNOSIS — I10 Essential (primary) hypertension: Secondary | ICD-10-CM | POA: Diagnosis not present

## 2012-01-19 DIAGNOSIS — R5381 Other malaise: Secondary | ICD-10-CM | POA: Diagnosis not present

## 2012-01-19 DIAGNOSIS — E870 Hyperosmolality and hypernatremia: Secondary | ICD-10-CM | POA: Diagnosis not present

## 2012-01-20 ENCOUNTER — Other Ambulatory Visit: Payer: Self-pay | Admitting: Infectious Diseases

## 2012-01-23 DIAGNOSIS — K2100 Gastro-esophageal reflux disease with esophagitis, without bleeding: Secondary | ICD-10-CM | POA: Diagnosis not present

## 2012-01-23 DIAGNOSIS — B20 Human immunodeficiency virus [HIV] disease: Secondary | ICD-10-CM | POA: Diagnosis not present

## 2012-01-23 DIAGNOSIS — R7989 Other specified abnormal findings of blood chemistry: Secondary | ICD-10-CM | POA: Diagnosis not present

## 2012-01-23 DIAGNOSIS — E785 Hyperlipidemia, unspecified: Secondary | ICD-10-CM | POA: Diagnosis not present

## 2012-01-24 ENCOUNTER — Other Ambulatory Visit: Payer: Self-pay | Admitting: Infectious Diseases

## 2012-04-23 ENCOUNTER — Other Ambulatory Visit: Payer: Self-pay | Admitting: Infectious Diseases

## 2012-05-22 ENCOUNTER — Other Ambulatory Visit: Payer: Self-pay | Admitting: Infectious Diseases

## 2012-05-24 ENCOUNTER — Other Ambulatory Visit: Payer: Medicare Other

## 2012-05-25 ENCOUNTER — Other Ambulatory Visit: Payer: Self-pay | Admitting: Infectious Diseases

## 2012-05-28 ENCOUNTER — Other Ambulatory Visit: Payer: Self-pay | Admitting: *Deleted

## 2012-05-28 ENCOUNTER — Other Ambulatory Visit: Payer: Self-pay | Admitting: Infectious Diseases

## 2012-05-28 ENCOUNTER — Other Ambulatory Visit (INDEPENDENT_AMBULATORY_CARE_PROVIDER_SITE_OTHER): Payer: Medicare Other

## 2012-05-28 LAB — CBC WITH DIFFERENTIAL/PLATELET
Basophils Absolute: 0 10*3/uL (ref 0.0–0.1)
Eosinophils Absolute: 0.1 10*3/uL (ref 0.0–0.7)
Eosinophils Relative: 2 % (ref 0–5)
Lymphocytes Relative: 31 % (ref 12–46)
Lymphs Abs: 1.6 10*3/uL (ref 0.7–4.0)
Neutrophils Relative %: 61 % (ref 43–77)
Platelets: 308 10*3/uL (ref 150–400)
RBC: 5.08 MIL/uL (ref 4.22–5.81)
RDW: 16.3 % — ABNORMAL HIGH (ref 11.5–15.5)
WBC: 5.1 10*3/uL (ref 4.0–10.5)

## 2012-05-28 LAB — COMPLETE METABOLIC PANEL WITH GFR
ALT: 49 U/L (ref 0–53)
AST: 29 U/L (ref 0–37)
CO2: 21 mEq/L (ref 19–32)
Chloride: 106 mEq/L (ref 96–112)
Creat: 1.18 mg/dL (ref 0.50–1.35)
GFR, Est African American: 84 mL/min
Sodium: 138 mEq/L (ref 135–145)
Total Bilirubin: 0.3 mg/dL (ref 0.3–1.2)
Total Protein: 7.4 g/dL (ref 6.0–8.3)

## 2012-05-28 MED ORDER — HYDROCHLOROTHIAZIDE 25 MG PO TABS: 25.0000 mg | ORAL_TABLET | Freq: Every day | ORAL | Status: DC

## 2012-05-28 MED ORDER — EFAVIRENZ-EMTRICITAB-TENOFOVIR 600-200-300 MG PO TABS: ORAL_TABLET | ORAL | Status: DC

## 2012-05-28 MED ORDER — ESOMEPRAZOLE MAGNESIUM 40 MG PO CPDR: DELAYED_RELEASE_CAPSULE | ORAL | Status: DC

## 2012-05-29 LAB — HIV-1 RNA QUANT-NO REFLEX-BLD: HIV 1 RNA Quant: <20 copies/mL (ref <20)

## 2012-05-30 LAB — T-HELPER CELL (CD4) - (RCID CLINIC ONLY)
CD4 % Helper T Cell: 28 % — ABNORMAL LOW (ref 33–55)
CD4 T Cell Abs: 470 uL (ref 400–2700)

## 2012-06-08 ENCOUNTER — Ambulatory Visit: Payer: Medicare Other | Admitting: Infectious Diseases

## 2012-07-19 ENCOUNTER — Other Ambulatory Visit: Payer: Self-pay | Admitting: *Deleted

## 2012-07-19 ENCOUNTER — Other Ambulatory Visit: Payer: Medicare Other

## 2012-07-19 DIAGNOSIS — I10 Essential (primary) hypertension: Secondary | ICD-10-CM | POA: Diagnosis not present

## 2012-07-19 DIAGNOSIS — R5381 Other malaise: Secondary | ICD-10-CM | POA: Diagnosis not present

## 2012-07-19 DIAGNOSIS — R7989 Other specified abnormal findings of blood chemistry: Secondary | ICD-10-CM

## 2012-07-20 ENCOUNTER — Encounter: Payer: Self-pay | Admitting: *Deleted

## 2012-07-20 LAB — HEMOGLOBIN A1C
Est. average glucose Bld gHb Est-mCnc: 126 mg/dL
Hgb A1c MFr Bld: 6 % — ABNORMAL HIGH (ref 4.8–5.6)

## 2012-07-23 ENCOUNTER — Encounter: Payer: Self-pay | Admitting: Internal Medicine

## 2012-07-23 ENCOUNTER — Ambulatory Visit (INDEPENDENT_AMBULATORY_CARE_PROVIDER_SITE_OTHER): Payer: Medicare Other | Admitting: Internal Medicine

## 2012-07-23 VITALS — BP 120/72 | HR 79 | Temp 98.0°F | Resp 16 | Ht 64.5 in | Wt 165.0 lb

## 2012-07-23 DIAGNOSIS — E785 Hyperlipidemia, unspecified: Secondary | ICD-10-CM | POA: Diagnosis not present

## 2012-07-23 DIAGNOSIS — B20 Human immunodeficiency virus [HIV] disease: Secondary | ICD-10-CM | POA: Diagnosis not present

## 2012-07-23 DIAGNOSIS — R7309 Other abnormal glucose: Secondary | ICD-10-CM

## 2012-07-23 DIAGNOSIS — R739 Hyperglycemia, unspecified: Secondary | ICD-10-CM

## 2012-07-23 NOTE — Assessment & Plan Note (Signed)
Has been very adherent with meds and doing well the whole time I have known him (2.5 years).
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
## 2012-07-23 NOTE — Assessment & Plan Note (Addendum)
Continue simvastatin and recheck before next visit in 4 mos.  CMP monitored by ID.

## 2012-07-23 NOTE — Patient Instructions (Addendum)
Watch your sweets.  Try to walk more.  We'll recheck your sugar average and cholesterol before your next visit in 4 months.

## 2012-08-24 ENCOUNTER — Encounter: Payer: Self-pay | Admitting: Infectious Diseases

## 2012-08-24 ENCOUNTER — Ambulatory Visit: Payer: Medicare Other | Admitting: Infectious Diseases

## 2012-08-24 VITALS — BP 107/74 | HR 73 | Temp 97.8°F | Wt 162.0 lb

## 2012-08-24 DIAGNOSIS — E785 Hyperlipidemia, unspecified: Secondary | ICD-10-CM | POA: Diagnosis not present

## 2012-08-24 DIAGNOSIS — B20 Human immunodeficiency virus [HIV] disease: Secondary | ICD-10-CM

## 2012-08-24 DIAGNOSIS — R7309 Other abnormal glucose: Secondary | ICD-10-CM | POA: Diagnosis not present

## 2012-08-24 DIAGNOSIS — R739 Hyperglycemia, unspecified: Secondary | ICD-10-CM

## 2012-08-24 LAB — LIPID PANEL
Cholesterol: 186 mg/dL (ref 0–200)
HDL: 42 mg/dL (ref >39)
Total CHOL/HDL Ratio: 4.4 Ratio
Triglycerides: 100 mg/dL (ref <150)

## 2012-08-24 LAB — COMPLETE METABOLIC PANEL WITH GFR
ALT: 46 U/L (ref 0–53)
Alkaline Phosphatase: 139 U/L — ABNORMAL HIGH (ref 39–117)
Sodium: 139 mEq/L (ref 135–145)
Total Bilirubin: 0.3 mg/dL (ref 0.3–1.2)
Total Protein: 7.2 g/dL (ref 6.0–8.3)

## 2012-08-24 LAB — CBC WITH DIFFERENTIAL/PLATELET
Basophils Absolute: 0.1 10*3/uL (ref 0.0–0.1)
Basophils Relative: 1 % (ref 0–1)
Eosinophils Absolute: 0.1 10*3/uL (ref 0.0–0.7)
MCH: 27.2 pg (ref 26.0–34.0)
MCHC: 34.2 g/dL (ref 30.0–36.0)
Neutrophils Relative %: 57 % (ref 43–77)
Platelets: 327 10*3/uL (ref 150–400)
RBC: 5.14 MIL/uL (ref 4.22–5.81)
RDW: 15.7 % — ABNORMAL HIGH (ref 11.5–15.5)

## 2012-08-24 NOTE — Patient Instructions (Signed)
Keep up the good work and follow up with Dr Drue Second as we discussed.
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
## 2012-08-25 LAB — HEMOGLOBIN A1C
Hgb A1c MFr Bld: 5.6 % (ref <5.7)
Mean Plasma Glucose: 114 mg/dL (ref <117)

## 2012-08-29 LAB — HIV-1 RNA QUANT-NO REFLEX-BLD: HIV 1 RNA Quant: <20 copies/mL (ref <20)

## 2012-11-20 ENCOUNTER — Other Ambulatory Visit: Payer: Medicare Other

## 2012-11-20 ENCOUNTER — Other Ambulatory Visit: Payer: Self-pay | Admitting: *Deleted

## 2012-11-20 DIAGNOSIS — E785 Hyperlipidemia, unspecified: Secondary | ICD-10-CM | POA: Diagnosis not present

## 2012-11-20 DIAGNOSIS — R7989 Other specified abnormal findings of blood chemistry: Secondary | ICD-10-CM

## 2012-11-20 DIAGNOSIS — I10 Essential (primary) hypertension: Secondary | ICD-10-CM

## 2012-11-21 LAB — LIPID PANEL
Chol/HDL Ratio: 3.7 ratio units (ref 0.0–5.0)
Cholesterol, Total: 187 mg/dL (ref 100–199)
HDL: 51 mg/dL (ref >39)
LDL Calculated: 115 mg/dL — ABNORMAL HIGH (ref 0–99)
Triglycerides: 107 mg/dL (ref 0–149)
VLDL Cholesterol Cal: 21 mg/dL (ref 5–40)

## 2012-11-21 LAB — HEMOGLOBIN A1C
Est. average glucose Bld gHb Est-mCnc: 126 mg/dL
Hgb A1c MFr Bld: 6 % — ABNORMAL HIGH (ref 4.8–5.6)

## 2012-11-23 ENCOUNTER — Encounter: Payer: Self-pay | Admitting: Internal Medicine

## 2012-11-23 ENCOUNTER — Other Ambulatory Visit: Payer: Self-pay | Admitting: Internal Medicine

## 2012-11-23 ENCOUNTER — Ambulatory Visit (INDEPENDENT_AMBULATORY_CARE_PROVIDER_SITE_OTHER): Payer: Medicare Other | Admitting: Internal Medicine

## 2012-11-23 VITALS — BP 120/82 | HR 65 | Temp 98.0°F | Resp 14 | Ht 64.5 in | Wt 155.6 lb

## 2012-11-23 DIAGNOSIS — R739 Hyperglycemia, unspecified: Secondary | ICD-10-CM

## 2012-11-23 DIAGNOSIS — E785 Hyperlipidemia, unspecified: Secondary | ICD-10-CM

## 2012-11-23 DIAGNOSIS — I10 Essential (primary) hypertension: Secondary | ICD-10-CM

## 2012-11-23 DIAGNOSIS — H6123 Impacted cerumen, bilateral: Secondary | ICD-10-CM

## 2012-11-23 DIAGNOSIS — H6120 Impacted cerumen, unspecified ear: Secondary | ICD-10-CM

## 2012-11-23 DIAGNOSIS — R7309 Other abnormal glucose: Secondary | ICD-10-CM | POA: Diagnosis not present

## 2012-11-23 DIAGNOSIS — F3289 Other specified depressive episodes: Secondary | ICD-10-CM

## 2012-11-23 DIAGNOSIS — B20 Human immunodeficiency virus [HIV] disease: Secondary | ICD-10-CM

## 2012-11-23 DIAGNOSIS — F32A Depression, unspecified: Secondary | ICD-10-CM

## 2012-11-23 DIAGNOSIS — F329 Major depressive disorder, single episode, unspecified: Secondary | ICD-10-CM

## 2012-11-23 MED ORDER — SIMVASTATIN 40 MG PO TABS: 40.0000 mg | ORAL_TABLET | Freq: Every day | ORAL | Status: DC

## 2012-11-23 MED ORDER — CITALOPRAM HYDROBROMIDE 40 MG PO TABS: 40.0000 mg | ORAL_TABLET | Freq: Every day | ORAL | Status: DC

## 2012-11-23 MED ORDER — CITALOPRAM HYDROBROMIDE 20 MG PO TABS: 20.0000 mg | ORAL_TABLET | Freq: Every day | ORAL | Status: DC
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
## 2013-01-15 ENCOUNTER — Other Ambulatory Visit: Payer: Medicare Other

## 2013-01-22 ENCOUNTER — Other Ambulatory Visit: Payer: Medicare Other

## 2013-01-22 DIAGNOSIS — B20 Human immunodeficiency virus [HIV] disease: Secondary | ICD-10-CM

## 2013-01-22 DIAGNOSIS — Z113 Encounter for screening for infections with a predominantly sexual mode of transmission: Secondary | ICD-10-CM | POA: Diagnosis not present

## 2013-01-22 LAB — CBC WITH DIFFERENTIAL/PLATELET
Basophils Absolute: 0 10*3/uL (ref 0.0–0.1)
Lymphocytes Relative: 35 % (ref 12–46)
Lymphs Abs: 1.9 10*3/uL (ref 0.7–4.0)
Neutro Abs: 3.2 10*3/uL (ref 1.7–7.7)
Neutrophils Relative %: 59 % (ref 43–77)
Platelets: 326 10*3/uL (ref 150–400)
RBC: 5.17 MIL/uL (ref 4.22–5.81)
RDW: 14.8 % (ref 11.5–15.5)
WBC: 5.4 10*3/uL (ref 4.0–10.5)

## 2013-01-22 LAB — COMPREHENSIVE METABOLIC PANEL
ALT: 42 U/L (ref 0–53)
AST: 30 U/L (ref 0–37)
Albumin: 4.3 g/dL (ref 3.5–5.2)
Alkaline Phosphatase: 129 U/L — ABNORMAL HIGH (ref 39–117)
CO2: 23 mEq/L (ref 19–32)
Creat: 1.3 mg/dL (ref 0.50–1.35)
Potassium: 3.7 mEq/L (ref 3.5–5.3)
Sodium: 135 mEq/L (ref 135–145)
Total Bilirubin: 0.4 mg/dL (ref 0.3–1.2)
Total Protein: 7.6 g/dL (ref 6.0–8.3)

## 2013-01-23 LAB — T-HELPER CELL (CD4) - (RCID CLINIC ONLY)
CD4 % Helper T Cell: 29 % — ABNORMAL LOW (ref 33–55)
CD4 T Cell Abs: 570 /uL (ref 400–2700)

## 2013-01-29 ENCOUNTER — Encounter: Payer: Self-pay | Admitting: Internal Medicine

## 2013-01-29 ENCOUNTER — Ambulatory Visit (INDEPENDENT_AMBULATORY_CARE_PROVIDER_SITE_OTHER): Payer: Medicare Other | Admitting: Internal Medicine

## 2013-01-29 VITALS — BP 107/73 | HR 69 | Temp 98.7°F | Wt 150.0 lb

## 2013-01-29 DIAGNOSIS — B20 Human immunodeficiency virus [HIV] disease: Secondary | ICD-10-CM | POA: Diagnosis not present

## 2013-01-29 DIAGNOSIS — Z23 Encounter for immunization: Secondary | ICD-10-CM | POA: Diagnosis not present
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
## 2013-03-19 ENCOUNTER — Other Ambulatory Visit: Payer: Medicare Other

## 2013-03-21 ENCOUNTER — Ambulatory Visit: Payer: Medicare Other | Admitting: Internal Medicine

## 2013-04-09 ENCOUNTER — Other Ambulatory Visit: Payer: Medicare Other

## 2013-04-11 ENCOUNTER — Ambulatory Visit: Payer: Medicare Other | Admitting: Internal Medicine

## 2013-04-18 ENCOUNTER — Encounter: Payer: Self-pay | Admitting: Internal Medicine

## 2013-04-18 ENCOUNTER — Ambulatory Visit (INDEPENDENT_AMBULATORY_CARE_PROVIDER_SITE_OTHER): Payer: Medicare Other | Admitting: Internal Medicine

## 2013-04-18 VITALS — BP 118/70 | HR 74 | Temp 96.5°F | Wt 154.0 lb

## 2013-04-18 DIAGNOSIS — Z23 Encounter for immunization: Secondary | ICD-10-CM | POA: Diagnosis not present

## 2013-04-18 DIAGNOSIS — R739 Hyperglycemia, unspecified: Secondary | ICD-10-CM

## 2013-04-18 DIAGNOSIS — B20 Human immunodeficiency virus [HIV] disease: Secondary | ICD-10-CM

## 2013-04-18 DIAGNOSIS — E785 Hyperlipidemia, unspecified: Secondary | ICD-10-CM | POA: Diagnosis not present

## 2013-04-18 DIAGNOSIS — R7309 Other abnormal glucose: Secondary | ICD-10-CM

## 2013-04-18 DIAGNOSIS — I10 Essential (primary) hypertension: Secondary | ICD-10-CM | POA: Diagnosis not present

## 2013-04-18 MED ORDER — TETANUS-DIPHTH-ACELL PERTUSSIS 5-2.5-18.5 LF-MCG/0.5 IM SUSP: 0.5000 mL | Freq: Once | INTRAMUSCULAR | Status: DC
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
## 2013-04-19 LAB — COMPREHENSIVE METABOLIC PANEL
ALT: 57 IU/L — ABNORMAL HIGH (ref 0–44)
AST: 35 IU/L (ref 0–40)
Albumin/Globulin Ratio: 1.5 (ref 1.1–2.5)
Albumin: 4.9 g/dL (ref 3.5–5.5)
Alkaline Phosphatase: 178 IU/L — ABNORMAL HIGH (ref 39–117)
BUN/Creatinine Ratio: 9 (ref 9–20)
BUN: 12 mg/dL (ref 6–24)
CO2: 15 mmol/L — ABNORMAL LOW (ref 18–29)
Calcium: 9.7 mg/dL (ref 8.7–10.2)
Chloride: 100 mmol/L (ref 97–108)
Creatinine, Ser: 1.34 mg/dL — ABNORMAL HIGH (ref 0.76–1.27)
GFR calc Af Amer: 72 mL/min/{1.73_m2} (ref >59)
GFR calc non Af Amer: 62 mL/min/{1.73_m2} (ref >59)
Globulin, Total: 3.2 g/dL (ref 1.5–4.5)
Glucose: 79 mg/dL (ref 65–99)
Potassium: 4.8 mmol/L (ref 3.5–5.2)
Sodium: 140 mmol/L (ref 134–144)
Total Bilirubin: 0.3 mg/dL (ref 0.0–1.2)
Total Protein: 8.1 g/dL (ref 6.0–8.5)

## 2013-04-19 LAB — HEMOGLOBIN A1C
Est. average glucose Bld gHb Est-mCnc: 128 mg/dL
Hgb A1c MFr Bld: 6.1 % — ABNORMAL HIGH (ref 4.8–5.6)

## 2013-04-19 LAB — CBC WITH DIFFERENTIAL/PLATELET
Basophils Absolute: 0 10*3/uL (ref 0.0–0.2)
Basos: 1 %
Eos: 2 %
Eosinophils Absolute: 0.1 10*3/uL (ref 0.0–0.4)
HCT: 46.8 % (ref 37.5–51.0)
Hemoglobin: 15.6 g/dL (ref 12.6–17.7)
Immature Grans (Abs): 0 10*3/uL (ref 0.0–0.1)
Immature Granulocytes: 0 %
Lymphocytes Absolute: 1.8 10*3/uL (ref 0.7–3.1)
Lymphs: 32 %
MCH: 28.6 pg (ref 26.6–33.0)
MCHC: 33.3 g/dL (ref 31.5–35.7)
MCV: 86 fL (ref 79–97)
Monocytes Absolute: 0.2 10*3/uL (ref 0.1–0.9)
Monocytes: 4 %
Neutrophils Absolute: 3.4 10*3/uL (ref 1.4–7.0)
Neutrophils Relative %: 61 %
RBC: 5.46 x10E6/uL (ref 4.14–5.80)
RDW: 15.5 % — ABNORMAL HIGH (ref 12.3–15.4)
WBC: 5.5 10*3/uL (ref 3.4–10.8)

## 2013-06-12 ENCOUNTER — Telehealth: Payer: Self-pay | Admitting: *Deleted

## 2013-06-12 DIAGNOSIS — B20 Human immunodeficiency virus [HIV] disease: Secondary | ICD-10-CM

## 2013-06-12 MED ORDER — EFAVIRENZ-EMTRICITAB-TENOFOVIR 600-200-300 MG PO TABS: ORAL_TABLET | ORAL | Status: DC

## 2013-06-12 NOTE — Telephone Encounter (Signed)
Refilled rx

## 2013-06-27 ENCOUNTER — Other Ambulatory Visit: Payer: Self-pay | Admitting: *Deleted

## 2013-06-27 DIAGNOSIS — E785 Hyperlipidemia, unspecified: Secondary | ICD-10-CM

## 2013-06-27 MED ORDER — ATORVASTATIN CALCIUM 20 MG PO TABS: 20.0000 mg | ORAL_TABLET | Freq: Every day | ORAL | Status: DC

## 2013-06-27 NOTE — Telephone Encounter (Signed)
Per Dr Drue SecondSnider called the patient and pharmacy and advised to D/C Zocor 40 and add Lipitor 20 once a day to his medication. Zocor was stopped due to drug/drug interaction.

## 2013-07-30 ENCOUNTER — Other Ambulatory Visit: Payer: Medicare Other

## 2013-08-13 ENCOUNTER — Ambulatory Visit: Payer: Medicare Other | Admitting: Internal Medicine

## 2013-08-16 ENCOUNTER — Ambulatory Visit: Payer: Medicare Other | Admitting: Internal Medicine

## 2013-08-27 ENCOUNTER — Other Ambulatory Visit: Payer: Self-pay | Admitting: Licensed Clinical Social Worker

## 2013-08-27 DIAGNOSIS — I10 Essential (primary) hypertension: Secondary | ICD-10-CM

## 2013-08-27 DIAGNOSIS — K219 Gastro-esophageal reflux disease without esophagitis: Secondary | ICD-10-CM

## 2013-08-27 MED ORDER — HYDROCHLOROTHIAZIDE 25 MG PO TABS: 25.0000 mg | ORAL_TABLET | Freq: Every day | ORAL | Status: DC

## 2013-08-27 MED ORDER — ESOMEPRAZOLE MAGNESIUM 40 MG PO CPDR: DELAYED_RELEASE_CAPSULE | ORAL | Status: DC

## 2013-09-20 ENCOUNTER — Ambulatory Visit: Payer: Medicare Other | Admitting: Internal Medicine

## 2013-10-02 ENCOUNTER — Other Ambulatory Visit: Payer: Medicare Other

## 2013-10-11 ENCOUNTER — Ambulatory Visit: Payer: Medicare Other | Admitting: Internal Medicine

## 2013-10-16 ENCOUNTER — Ambulatory Visit: Payer: Medicare Other | Admitting: Internal Medicine

## 2013-10-30 ENCOUNTER — Other Ambulatory Visit: Payer: Medicare Other

## 2013-10-30 DIAGNOSIS — Z79899 Other long term (current) drug therapy: Secondary | ICD-10-CM | POA: Diagnosis not present

## 2013-10-30 DIAGNOSIS — B20 Human immunodeficiency virus [HIV] disease: Secondary | ICD-10-CM | POA: Diagnosis not present

## 2013-10-30 LAB — CBC WITH DIFFERENTIAL/PLATELET
BASOS ABS: 0 10*3/uL (ref 0.0–0.1)
BASOS PCT: 0 % (ref 0–1)
EOS ABS: 0.1 10*3/uL (ref 0.0–0.7)
EOS PCT: 1 % (ref 0–5)
HEMATOCRIT: 42 % (ref 39.0–52.0)
HEMOGLOBIN: 14.7 g/dL (ref 13.0–17.0)
Lymphocytes Relative: 26 % (ref 12–46)
Lymphs Abs: 1.8 10*3/uL (ref 0.7–4.0)
MCH: 28.5 pg (ref 26.0–34.0)
MCHC: 35 g/dL (ref 30.0–36.0)
MCV: 81.4 fL (ref 78.0–100.0)
MONO ABS: 0.3 10*3/uL (ref 0.1–1.0)
MONOS PCT: 5 % (ref 3–12)
Neutro Abs: 4.7 10*3/uL (ref 1.7–7.7)
Neutrophils Relative %: 68 % (ref 43–77)
Platelets: 303 10*3/uL (ref 150–400)
RBC: 5.16 MIL/uL (ref 4.22–5.81)
RDW: 15.2 % (ref 11.5–15.5)
WBC: 6.9 10*3/uL (ref 4.0–10.5)

## 2013-10-30 LAB — COMPLETE METABOLIC PANEL WITH GFR
ALK PHOS: 154 U/L — AB (ref 39–117)
ALT: 53 U/L (ref 0–53)
AST: 33 U/L (ref 0–37)
Albumin: 4.4 g/dL (ref 3.5–5.2)
BILIRUBIN TOTAL: 0.4 mg/dL (ref 0.2–1.2)
BUN: 25 mg/dL — AB (ref 6–23)
CO2: 22 mEq/L (ref 19–32)
CREATININE: 1.43 mg/dL — AB (ref 0.50–1.35)
Calcium: 9.5 mg/dL (ref 8.4–10.5)
Chloride: 100 mEq/L (ref 96–112)
GFR, EST NON AFRICAN AMERICAN: 57 mL/min — AB
GFR, Est African American: 66 mL/min
GLUCOSE: 80 mg/dL (ref 70–99)
Potassium: 3.7 mEq/L (ref 3.5–5.3)
Sodium: 136 mEq/L (ref 135–145)
Total Protein: 7.9 g/dL (ref 6.0–8.3)

## 2013-10-30 LAB — LIPID PANEL
Cholesterol: 152 mg/dL (ref 0–200)
HDL: 54 mg/dL (ref >39)
LDL CALC: 80 mg/dL (ref 0–99)
Total CHOL/HDL Ratio: 2.8 Ratio
Triglycerides: 89 mg/dL (ref <150)
VLDL: 18 mg/dL (ref 0–40)

## 2013-10-31 LAB — T-HELPER CELL (CD4) - (RCID CLINIC ONLY)
CD4 % Helper T Cell: 28 % — ABNORMAL LOW (ref 33–55)
CD4 T CELL ABS: 490 /uL (ref 400–2700)

## 2013-11-01 LAB — HIV-1 RNA QUANT-NO REFLEX-BLD: HIV-1 RNA Quant, Log: <1.3 {Log} (ref <1.30)

## 2013-11-11 ENCOUNTER — Encounter: Payer: Self-pay | Admitting: Internal Medicine

## 2013-11-11 ENCOUNTER — Ambulatory Visit (INDEPENDENT_AMBULATORY_CARE_PROVIDER_SITE_OTHER): Payer: Medicare Other | Admitting: Internal Medicine

## 2013-11-11 VITALS — BP 126/80 | HR 72 | Temp 98.1°F | Ht 64.0 in | Wt 156.0 lb

## 2013-11-11 DIAGNOSIS — I10 Essential (primary) hypertension: Secondary | ICD-10-CM | POA: Diagnosis not present

## 2013-11-11 DIAGNOSIS — Z23 Encounter for immunization: Secondary | ICD-10-CM

## 2013-11-11 DIAGNOSIS — F3289 Other specified depressive episodes: Secondary | ICD-10-CM

## 2013-11-11 DIAGNOSIS — F329 Major depressive disorder, single episode, unspecified: Secondary | ICD-10-CM

## 2013-11-11 DIAGNOSIS — R7309 Other abnormal glucose: Secondary | ICD-10-CM | POA: Diagnosis not present

## 2013-11-11 DIAGNOSIS — B20 Human immunodeficiency virus [HIV] disease: Secondary | ICD-10-CM

## 2013-11-11 DIAGNOSIS — H6120 Impacted cerumen, unspecified ear: Secondary | ICD-10-CM | POA: Diagnosis not present

## 2013-11-11 DIAGNOSIS — F32A Depression, unspecified: Secondary | ICD-10-CM

## 2013-11-11 DIAGNOSIS — R739 Hyperglycemia, unspecified: Secondary | ICD-10-CM

## 2013-11-11 DIAGNOSIS — H6123 Impacted cerumen, bilateral: Secondary | ICD-10-CM

## 2013-11-11 MED ORDER — TETANUS-DIPHTH-ACELL PERTUSSIS 5-2.5-18.5 LF-MCG/0.5 IM SUSP: 0.5000 mL | Freq: Once | INTRAMUSCULAR | Status: DC
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
## 2013-11-14 ENCOUNTER — Ambulatory Visit: Payer: Medicare Other | Admitting: Internal Medicine

## 2013-11-28 ENCOUNTER — Telehealth: Payer: Self-pay | Admitting: *Deleted

## 2013-11-28 NOTE — Telephone Encounter (Signed)
Patient mother called and left voicemail message wanting to know if patient should keep his labwork appointment. I tried calling her back and get a fast busy signal, tried calling 3 times with same result.

## 2013-11-30 ENCOUNTER — Other Ambulatory Visit: Payer: Self-pay | Admitting: Internal Medicine

## 2013-12-02 ENCOUNTER — Ambulatory Visit: Payer: Medicare Other | Admitting: Internal Medicine

## 2013-12-03 ENCOUNTER — Ambulatory Visit: Payer: Medicare Other | Admitting: Internal Medicine

## 2013-12-17 ENCOUNTER — Ambulatory Visit (INDEPENDENT_AMBULATORY_CARE_PROVIDER_SITE_OTHER): Payer: Medicare Other | Admitting: Internal Medicine

## 2013-12-17 ENCOUNTER — Encounter: Payer: Self-pay | Admitting: Internal Medicine

## 2013-12-17 VITALS — BP 118/69 | HR 69 | Temp 98.2°F | Wt 155.0 lb

## 2013-12-17 DIAGNOSIS — Z23 Encounter for immunization: Secondary | ICD-10-CM | POA: Diagnosis not present

## 2013-12-17 DIAGNOSIS — Z21 Asymptomatic human immunodeficiency virus [HIV] infection status: Secondary | ICD-10-CM

## 2013-12-17 LAB — RPR

## 2013-12-17 NOTE — Progress Notes (Signed)
Patient ID: Isaac Miller, male   DOB: February 07, 1966, 48 y.o.   MRN: [id number]       Patient ID: Isaac Miller, male   DOB: 08-16-1965, 48 y.o.   MRN: [id number]  HPI 48yo M with HIV, CD 4 count of 490/VL<20 on atripla doing well. He was last seen 10 months ago. Doing well with adherence. No health complaints  Outpatient Encounter Prescriptions as of 12/17/2013  Medication Sig  . atorvastatin (LIPITOR) 20 MG tablet Take 1 tablet (20 mg total) by mouth daily.  . citalopram (CELEXA) 40 MG tablet Take 1 tablet by mouth once daily.  Marland Kitchen efavirenz-emtricitabine-tenofovir (ATRIPLA) 600-200-300 MG per tablet TAKE 1 TABLET BY MOUTH EVERY NIGHT AT BEDTIME  . esomeprazole (NEXIUM) 40 MG capsule TAKE 1 CAPSULE BY MOUTH EVERY DAY  . hydrochlorothiazide (HYDRODIURIL) 25 MG tablet Take 1 tablet (25 mg total) by mouth daily.  . Tdap (BOOSTRIX) 5-2.5-18.5 LF-MCG/0.5 injection Inject 0.5 mLs into the muscle once.     Patient Active Problem List   Diagnosis Date Noted  . Hyperlipidemia LDL goal < 100 07/23/2012  . Hyperglycemia 07/23/2012  . Screening examination for venereal disease 10/08/2010  . Encounter for long-term (current) use of other medications 10/08/2010  . FRACTURE NOS, CLOSED 10/24/2006  . HEAD TRAUMA, CLOSED 10/24/2006  . HIV DISEASE 09/18/2006     Health Maintenance Due  Topic Date Due  . Tetanus/tdap  08/31/1983  . Influenza Vaccine  11/02/2013     Review of Systems Review of Systems  Constitutional: Negative for fever, chills, diaphoresis, activity change, appetite change, fatigue and unexpected weight change.  HENT: Negative for congestion, sore throat, rhinorrhea, sneezing, trouble swallowing and sinus pressure.  Eyes: Negative for photophobia and visual disturbance.  Respiratory: Negative for cough, chest tightness, shortness of breath, wheezing and stridor.  Cardiovascular: Negative for chest pain, palpitations and leg swelling.  Gastrointestinal: Negative for nausea,  vomiting, abdominal pain, diarrhea, constipation, blood in stool, abdominal distention and anal bleeding.  Genitourinary: Negative for dysuria, hematuria, flank pain and difficulty urinating.  Musculoskeletal: Negative for myalgias, back pain, joint swelling, arthralgias and gait problem.  Skin: Negative for color change, pallor, rash and wound.  Neurological: Negative for dizziness, tremors, weakness and light-headedness.  Hematological: Negative for adenopathy. Does not bruise/bleed easily.  Psychiatric/Behavioral: Negative for behavioral problems, confusion, sleep disturbance, dysphoric mood, decreased concentration and agitation.    Physical Exam   BP 118/69  Pulse 69  Temp(Src) 98.2 F (36.8 C) (Oral)  Wt 155 lb (70.308 kg) Physical Exam  Constitutional: He is oriented to person, place, and time. He appears well-developed and well-nourished. No distress.  HENT:  Mouth/Throat: Oropharynx is clear and moist. No oropharyngeal exudate.  Cardiovascular: Normal rate, regular rhythm and normal heart sounds. Exam reveals no gallop and no friction rub.  No murmur heard.  Pulmonary/Chest: Effort normal and breath sounds normal. No respiratory distress. He has no wheezes.  Abdominal: Soft. Bowel sounds are normal. He exhibits no distension. There is no tenderness.  Lymphadenopathy:  He has no cervical adenopathy.  Neurological: He is alert and oriented to person, place, and time.  Skin: Skin is warm and dry. No rash noted. No erythema.  Psychiatric: He has a normal mood and affect. His behavior is normal.    Lab Results  Component Value Date   CD4TCELL 28* 10/30/2013   Lab Results  Component Value Date   CD4TABS 490 10/30/2013   CD4TABS 570 01/22/2013   CD4TABS 460 08/24/2012  Lab Results  Component Value Date   HIV1RNAQUANT <20 10/30/2013   Lab Results  Component Value Date   HEPBSAB NO 05/29/2006   No results found for this basename: RPR    CBC Lab Results  Component  Value Date   WBC 6.9 10/30/2013   RBC 5.16 10/30/2013   HGB 14.7 10/30/2013   HCT 42.0 10/30/2013   PLT 303 10/30/2013   MCV 81.4 10/30/2013   MCH 28.5 10/30/2013   MCHC 35.0 10/30/2013   RDW 15.2 10/30/2013   LYMPHSABS 1.8 10/30/2013   MONOABS 0.3 10/30/2013   EOSABS 0.1 10/30/2013   BASOSABS 0.0 10/30/2013   BMET Lab Results  Component Value Date   NA 136 10/30/2013   K 3.7 10/30/2013   CL 100 10/30/2013   CO2 22 10/30/2013   GLUCOSE 80 10/30/2013   BUN 25* 10/30/2013   CREATININE 1.43* 10/30/2013   CALCIUM 9.5 10/30/2013   GFRNONAA 57* 10/30/2013   GFRAA 66 10/30/2013     Assessment and Plan   hiv = continue with atripla for now. Will check [id number].  ckd = will check ua nad proteinuria  Health maintenance = wil check rpr, hep b s ab, urine cytology. Give flu shot today  RTC 6 months

## 2013-12-18 LAB — HEPATITIS B SURFACE ANTIBODY,QUALITATIVE: Hep B S Ab: POSITIVE — AB

## 2013-12-26 ENCOUNTER — Other Ambulatory Visit: Payer: Self-pay | Admitting: Internal Medicine

## 2014-03-04 ENCOUNTER — Other Ambulatory Visit: Payer: Self-pay | Admitting: Internal Medicine

## 2014-04-18 ENCOUNTER — Encounter: Payer: Self-pay | Admitting: Internal Medicine

## 2014-04-18 ENCOUNTER — Ambulatory Visit (INDEPENDENT_AMBULATORY_CARE_PROVIDER_SITE_OTHER): Payer: Medicare Other | Admitting: Internal Medicine

## 2014-04-18 VITALS — BP 110/66 | HR 65 | Temp 97.6°F | Resp 10 | Wt 155.5 lb

## 2014-04-18 DIAGNOSIS — F329 Major depressive disorder, single episode, unspecified: Secondary | ICD-10-CM | POA: Diagnosis not present

## 2014-04-18 DIAGNOSIS — B20 Human immunodeficiency virus [HIV] disease: Secondary | ICD-10-CM | POA: Diagnosis not present

## 2014-04-18 DIAGNOSIS — H6123 Impacted cerumen, bilateral: Secondary | ICD-10-CM | POA: Diagnosis not present

## 2014-04-18 DIAGNOSIS — F32A Depression, unspecified: Secondary | ICD-10-CM

## 2014-04-18 DIAGNOSIS — R5383 Other fatigue: Secondary | ICD-10-CM

## 2014-04-18 NOTE — Progress Notes (Signed)
Patient ID: Isaac Miller, male   DOB: [birth year], 49 y.o.   MRN: [id number]    Facility  [facility]    Place of Service:   OFFICE   No Known Allergies  Chief Complaint  Patient presents with  . Acute Visit    Fatigue, overall not feeling well. Patient c/o cold hands x couple weeks     HPI:  49 yo male seen today for above. Hx HIV on atripla. Mother present. Pt has been sleeping a lot and has decreased energy x 2 weeks. He has trouble hearing but this is not new. No other c/o. He is scheduled to see ID in March. He notes increased depression x 1-2 mos. Takes citalopram for depression. No SI/HI. He feels lonely.   Medications: Patient's Medications  New Prescriptions   No medications on file  Previous Medications   ATORVASTATIN (LIPITOR) 20 MG TABLET    TAKE 1 TABLET BY MOUTH DAILY   CITALOPRAM (CELEXA) 40 MG TABLET    TAKE 1 TABLET BY MOUTH ONCE A DAY   EFAVIRENZ-EMTRICITABINE-TENOFOVIR (ATRIPLA) 600-200-300 MG PER TABLET    TAKE 1 TABLET BY MOUTH EVERY NIGHT AT BEDTIME   ESOMEPRAZOLE (NEXIUM) 40 MG CAPSULE    TAKE 1 CAPSULE BY MOUTH EVERY DAY   HYDROCHLOROTHIAZIDE (HYDRODIURIL) 25 MG TABLET    Take 1 tablet (25 mg total) by mouth daily.   TDAP (BOOSTRIX) 5-2.5-18.5 LF-MCG/0.5 INJECTION    Inject 0.5 mLs into the muscle once.  Modified Medications   No medications on file  Discontinued Medications   No medications on file     Review of Systems  As above. All other systems reviewed are negative.  Filed Vitals:   04/18/14 0917  BP: 110/66  Pulse: 65  Temp: 97.6 F (36.4 C)  TempSrc: Oral  Resp: 10  Weight: 155 lb 8 oz (70.534 kg)  SpO2: 98%   Body mass index is 26.68 kg/(m^2).  Physical Exam CONSTITUTIONAL: Looks well in NAD. Awake, alert and oriented x 3 HEENT: R>L cerumen impaction. PERRLA. No scleral icterus. Oropharynx clear and without exudate NECK: Supple. Nontender. No palpable cervical or supraclavicular lymph nodes. No carotid bruit b/l. No thyromegaly or  thyroid mass palpable.  CVS: Regular rate without murmur, gallop or rub. LUNGS: CTA b/l no wheezing, rales or rhonchi. ABDOMEN: Bowel sounds present x 4. Soft, nontender, nondistended. No palpable mass or bruit. No hepatosplenomegaly EXTREMITIES: No edema b/l. Distal pulses palpable. No calf tenderness PSYCH: flat affect NEURO: DTRs intact   Labs reviewed: No visits with results within 3 Month(s) from this visit. Latest known visit with results is:  Office Visit on 12/17/2013  Component Date Value Ref Range Status  . RPR Ser Ql 12/17/2013 NON REAC  NON REAC Final  . Hep B S Ab 12/17/2013 POS* NEGATIVE Final     Assessment/Plan    ICD-9-CM ICD-10-CM   1. Other fatigue 780.79 R53.83 CMP     CBC with Differential     TSH     Urinalysis with Reflex Microscopic  2. Depression 311 F32.9   3. Cerumen impaction, bilateral 380.4 H61.23   4. Human immunodeficiency virus (HIV) disease 042 B20     PROCEDURE: After verbal consent obtained, b/l ear lavage completed and curette used to remove cerumen impaction. Pt tolerated procedure well and hearing drastically improved. Recommend prn Debrox gtts OTC.  --pt provided with contact information of area mental health practices/counseling services  --f/u with ID and Dr Renato Gailseed as scheduled  --will  call with lab results  --RTO if he feels no better   [facility] S. Ancil Linsey  [facility] [address] [place], [place] [address] [phone] Office (Wednesdays and Fridays 8 AM - 5 PM) [phone] Cell (Monday-Friday 8 AM - 5 PM)

## 2014-04-18 NOTE — Patient Instructions (Signed)
Recommend Debrox drops OTC to use as needed for ear wax.  F/u with Dr Drue SecondSnider as scheduled  Contact mental health provider for further depression treatment options. List of area providers given to patient

## 2014-04-19 LAB — CBC WITH DIFFERENTIAL/PLATELET
BASOS: 1 %
Basophils Absolute: 0 10*3/uL (ref 0.0–0.2)
Eos: 1 %
Eosinophils Absolute: 0.1 10*3/uL (ref 0.0–0.4)
HEMATOCRIT: 43.7 % (ref 37.5–51.0)
HEMOGLOBIN: 14.2 g/dL (ref 12.6–17.7)
IMMATURE GRANULOCYTES: 0 %
Immature Grans (Abs): 0 10*3/uL (ref 0.0–0.1)
Lymphocytes Absolute: 1.5 10*3/uL (ref 0.7–3.1)
Lymphs: 28 %
MCH: 28 pg (ref 26.6–33.0)
MCHC: 32.5 g/dL (ref 31.5–35.7)
MCV: 86 fL (ref 79–97)
MONOCYTES: 6 %
MONOS ABS: 0.3 10*3/uL (ref 0.1–0.9)
Neutrophils Absolute: 3.2 10*3/uL (ref 1.4–7.0)
Neutrophils Relative %: 64 %
RBC: 5.07 x10E6/uL (ref 4.14–5.80)
RDW: 15.5 % — ABNORMAL HIGH (ref 12.3–15.4)
WBC: 5.1 10*3/uL (ref 3.4–10.8)

## 2014-04-19 LAB — URINALYSIS, ROUTINE W REFLEX MICROSCOPIC
Bilirubin, UA: NEGATIVE
KETONES UA: NEGATIVE
Leukocytes, UA: NEGATIVE
Nitrite, UA: NEGATIVE
RBC UA: NEGATIVE
SPEC GRAV UA: 1.027 (ref 1.005–1.030)
Urobilinogen, Ur: 0.2 mg/dL (ref 0.2–1.0)
pH, UA: 6 (ref 5.0–7.5)

## 2014-04-19 LAB — COMPREHENSIVE METABOLIC PANEL
ALK PHOS: 161 IU/L — AB (ref 39–117)
ALT: 63 IU/L — AB (ref 0–44)
AST: 37 IU/L (ref 0–40)
Albumin/Globulin Ratio: 1.7 (ref 1.1–2.5)
Albumin: 4.3 g/dL (ref 3.5–5.5)
BUN/Creatinine Ratio: 14 (ref 9–20)
BUN: 20 mg/dL (ref 6–24)
CO2: 20 mmol/L (ref 18–29)
Calcium: 9.3 mg/dL (ref 8.7–10.2)
Chloride: 107 mmol/L (ref 97–108)
Creatinine, Ser: 1.39 mg/dL — ABNORMAL HIGH (ref 0.76–1.27)
GFR calc Af Amer: 68 mL/min/{1.73_m2} (ref >59)
GFR calc non Af Amer: 59 mL/min/{1.73_m2} — ABNORMAL LOW (ref >59)
Globulin, Total: 2.6 g/dL (ref 1.5–4.5)
Glucose: 86 mg/dL (ref 65–99)
Potassium: 4.8 mmol/L (ref 3.5–5.2)
Sodium: 144 mmol/L (ref 134–144)
Total Bilirubin: <0.2 mg/dL (ref 0.0–1.2)
Total Protein: 6.9 g/dL (ref 6.0–8.5)

## 2014-04-19 LAB — MICROSCOPIC EXAMINATION
BACTERIA UA: NONE SEEN
Casts: NONE SEEN /lpf
Epithelial Cells (non renal): NONE SEEN /hpf (ref 0–10)
RBC, UA: NONE SEEN /hpf (ref 0–?)

## 2014-04-19 LAB — TSH: TSH: 0.957 u[IU]/mL (ref 0.450–4.500)

## 2014-05-12 ENCOUNTER — Other Ambulatory Visit: Payer: Medicare Other

## 2014-05-15 ENCOUNTER — Ambulatory Visit: Payer: Medicare Other | Admitting: Internal Medicine

## 2014-06-04 ENCOUNTER — Other Ambulatory Visit: Payer: Self-pay | Admitting: Internal Medicine

## 2014-06-04 DIAGNOSIS — B20 Human immunodeficiency virus [HIV] disease: Secondary | ICD-10-CM

## 2014-06-16 ENCOUNTER — Ambulatory Visit: Payer: Self-pay | Admitting: Internal Medicine

## 2014-06-16 ENCOUNTER — Encounter: Payer: Self-pay | Admitting: Internal Medicine

## 2014-06-16 DIAGNOSIS — Z0289 Encounter for other administrative examinations: Secondary | ICD-10-CM

## 2014-06-18 ENCOUNTER — Ambulatory Visit: Payer: Medicare Other | Admitting: Internal Medicine

## 2014-06-24 ENCOUNTER — Ambulatory Visit: Payer: Medicare Other | Admitting: Internal Medicine

## 2014-06-25 ENCOUNTER — Encounter: Payer: Self-pay | Admitting: Internal Medicine

## 2014-06-25 ENCOUNTER — Ambulatory Visit (INDEPENDENT_AMBULATORY_CARE_PROVIDER_SITE_OTHER): Payer: Medicare Other | Admitting: Internal Medicine

## 2014-06-25 VITALS — BP 110/60 | HR 71 | Temp 97.7°F | Resp 18 | Ht 64.0 in | Wt 151.8 lb

## 2014-06-25 DIAGNOSIS — E785 Hyperlipidemia, unspecified: Secondary | ICD-10-CM | POA: Diagnosis not present

## 2014-06-25 DIAGNOSIS — F32A Depression, unspecified: Secondary | ICD-10-CM

## 2014-06-25 DIAGNOSIS — K219 Gastro-esophageal reflux disease without esophagitis: Secondary | ICD-10-CM

## 2014-06-25 DIAGNOSIS — I10 Essential (primary) hypertension: Secondary | ICD-10-CM

## 2014-06-25 DIAGNOSIS — B20 Human immunodeficiency virus [HIV] disease: Secondary | ICD-10-CM | POA: Diagnosis not present

## 2014-06-25 DIAGNOSIS — F329 Major depressive disorder, single episode, unspecified: Secondary | ICD-10-CM

## 2014-06-25 DIAGNOSIS — R739 Hyperglycemia, unspecified: Secondary | ICD-10-CM

## 2014-06-25 MED ORDER — BUPROPION HCL ER (SR) 150 MG PO TB12: 150.0000 mg | ORAL_TABLET | Freq: Two times a day (BID) | ORAL | Status: DC

## 2014-06-25 MED ORDER — TETANUS-DIPHTH-ACELL PERTUSSIS 5-2.5-18.5 LF-MCG/0.5 IM SUSP: 0.5000 mL | Freq: Once | INTRAMUSCULAR | Status: DC

## 2014-06-25 NOTE — Patient Instructions (Addendum)
Needs to follow up with Dr Drue SecondSnider for management of HIV  Continue other medications as ordered  Follow up in 1 month to reck depression. Smoking cessation discussed and highly urged.
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
## 2014-07-07 ENCOUNTER — Other Ambulatory Visit: Payer: Self-pay | Admitting: Internal Medicine

## 2014-08-04 ENCOUNTER — Encounter: Payer: Self-pay | Admitting: Internal Medicine

## 2014-08-04 ENCOUNTER — Ambulatory Visit (INDEPENDENT_AMBULATORY_CARE_PROVIDER_SITE_OTHER): Payer: Medicare Other | Admitting: Internal Medicine

## 2014-08-04 VITALS — BP 110/70 | HR 71 | Temp 98.0°F | Resp 18 | Ht 64.0 in | Wt 149.0 lb

## 2014-08-04 DIAGNOSIS — R739 Hyperglycemia, unspecified: Secondary | ICD-10-CM

## 2014-08-04 DIAGNOSIS — H918X3 Other specified hearing loss, bilateral: Secondary | ICD-10-CM

## 2014-08-04 DIAGNOSIS — E785 Hyperlipidemia, unspecified: Secondary | ICD-10-CM | POA: Diagnosis not present

## 2014-08-04 DIAGNOSIS — H6123 Impacted cerumen, bilateral: Secondary | ICD-10-CM

## 2014-08-04 DIAGNOSIS — I10 Essential (primary) hypertension: Secondary | ICD-10-CM | POA: Diagnosis not present

## 2014-08-04 DIAGNOSIS — B20 Human immunodeficiency virus [HIV] disease: Secondary | ICD-10-CM | POA: Diagnosis not present

## 2014-08-04 DIAGNOSIS — Z23 Encounter for immunization: Secondary | ICD-10-CM

## 2014-08-04 DIAGNOSIS — K219 Gastro-esophageal reflux disease without esophagitis: Secondary | ICD-10-CM

## 2014-08-04 MED ORDER — ATORVASTATIN CALCIUM 20 MG PO TABS: 20.0000 mg | ORAL_TABLET | Freq: Every day | ORAL | Status: DC
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
## 2014-08-05 ENCOUNTER — Encounter: Payer: Self-pay | Admitting: Internal Medicine

## 2014-08-05 ENCOUNTER — Ambulatory Visit (INDEPENDENT_AMBULATORY_CARE_PROVIDER_SITE_OTHER): Payer: Medicare Other | Admitting: Internal Medicine

## 2014-08-05 VITALS — BP 128/92 | HR 68 | Temp 99.5°F | Ht 64.0 in | Wt 148.0 lb

## 2014-08-05 DIAGNOSIS — R809 Proteinuria, unspecified: Secondary | ICD-10-CM | POA: Diagnosis not present

## 2014-08-05 DIAGNOSIS — B20 Human immunodeficiency virus [HIV] disease: Secondary | ICD-10-CM | POA: Diagnosis not present

## 2014-08-05 MED ORDER — ELVITEG-COBIC-EMTRICIT-TENOFAF 150-150-200-10 MG PO TABS: 1.0000 | ORAL_TABLET | Freq: Every day | ORAL | Status: DC

## 2014-08-05 NOTE — Progress Notes (Signed)
Patient ID: Isaac FolksDavid W Miller, male   DOB: January 19, 1966, 49 y.o.   MRN: [id number]       Patient ID: Isaac FolksDavid W Perriello, male   DOB: January 19, 1966, 49 y.o.   MRN: [id number]  HPI Mr. Hyacinth MeekerMiller is a 49yo M with HIV disease, last seen 8 months ago, CD 4 count of 490/VL<20, (july 2015). He states that he takes his atripla at 5pm at night, it is dispensed by his mother. He is in good state of health.  Outpatient Encounter Prescriptions as of 08/05/2014  Medication Sig  . atorvastatin (LIPITOR) 20 MG tablet Take 1 tablet (20 mg total) by mouth daily.  . ATRIPLA 600-200-300 MG per tablet TAKE 1 TABLET BY MOUTH AT BEDTIME  . buPROPion (WELLBUTRIN SR) 150 MG 12 hr tablet Take 1 tablet (150 mg total) by mouth 2 (two) times daily.  . citalopram (CELEXA) 40 MG tablet TAKE 1 TABLET BY MOUTH DAILY  . hydrochlorothiazide (HYDRODIURIL) 25 MG tablet TAKE 1 TABLET BY MOUTH DAILY  . NEXIUM 40 MG capsule TAKE 1 CAPSULE BY MOUTH EVERY DAY   No facility-administered encounter medications on file as of 08/05/2014.     Patient Active Problem List   Diagnosis Date Noted  . GERD (gastroesophageal reflux disease) 06/25/2014  . Hyperlipidemia with target LDL less than 100 07/23/2012  . Hyperglycemia 07/23/2012  . Encounter for long-term (current) use of other medications 10/08/2010  . FRACTURE NOS, CLOSED 10/24/2006  . HEAD TRAUMA, CLOSED 10/24/2006  . Human immunodeficiency virus (HIV) disease 09/18/2006     Health Maintenance Due  Topic Date Due  . TETANUS/TDAP  08/31/1983     Review of Systems Review of Systems  Constitutional: Negative for fever, chills, diaphoresis, activity change, appetite change, fatigue and unexpected weight change.  HENT: Negative for congestion, sore throat, rhinorrhea, sneezing, trouble swallowing and sinus pressure.  Eyes: Negative for photophobia and visual disturbance.  Respiratory: Negative for cough, chest tightness, shortness of breath, wheezing and stridor.  Cardiovascular: Negative for  chest pain, palpitations and leg swelling.  Gastrointestinal: Negative for nausea, vomiting, abdominal pain, diarrhea, constipation, blood in stool, abdominal distention and anal bleeding.  Genitourinary: Negative for dysuria, hematuria, flank pain and difficulty urinating.  Musculoskeletal: Negative for myalgias, back pain, joint swelling, arthralgias and gait problem.  Skin: Negative for color change, pallor, rash and wound.  Neurological: Negative for dizziness, tremors, weakness and light-headedness.  Hematological: Negative for adenopathy. Does not bruise/bleed easily.  Psychiatric/Behavioral: Negative for behavioral problems, confusion, sleep disturbance, dysphoric mood, decreased concentration and agitation.    Physical Exam   Ht 5\' 4"  (1.626 m)  Wt 148 lb (67.132 kg)  BMI 25.39 kg/m2 Physical Exam  Constitutional: He is oriented to person, place, and time. He appears well-developed and well-nourished. No distress. Simple demeaner HENT: poor dentition Mouth/Throat: Oropharynx is clear and moist. No oropharyngeal exudate.  Cardiovascular: Normal rate, regular rhythm and normal heart sounds. Exam reveals no gallop and no friction rub.  No murmur heard.  Pulmonary/Chest: Effort normal and breath sounds normal. No respiratory distress. He has no wheezes.  Abdominal: Soft. Bowel sounds are normal. He exhibits no distension. There is no tenderness.  Lymphadenopathy:  He has no cervical adenopathy.  Neurological: He is alert and oriented to person, place, and time.  Skin: Skin is warm and dry. No rash noted. No erythema.  Psychiatric: He has a normal mood and affect. His behavior is normal.     Lab Results  Component Value Date   CD4TCELL  28* 10/30/2013   Lab Results  Component Value Date   CD4TABS 490 10/30/2013   CD4TABS 570 01/22/2013   CD4TABS 460 08/24/2012   Lab Results  Component Value Date   HIV1RNAQUANT <20 10/30/2013   Lab Results  Component Value Date    HEPBSAB POS* 12/17/2013   No results found for: RPR  CBC Lab Results  Component Value Date   WBC 5.1 04/18/2014   RBC 5.07 04/18/2014   HGB 14.2 04/18/2014   HCT 43.7 04/18/2014   PLT 303 10/30/2013   MCV 86 04/18/2014   MCH 28.0 04/18/2014   MCHC 32.5 04/18/2014   RDW 15.5* 04/18/2014   LYMPHSABS 1.5 04/18/2014   MONOABS 0.3 10/30/2013   EOSABS 0.1 04/18/2014   BASOSABS 0.0 04/18/2014   BMET Lab Results  Component Value Date   NA 144 04/18/2014   K 4.8 04/18/2014   CL 107 04/18/2014   CO2 20 04/18/2014   GLUCOSE 86 04/18/2014   BUN 20 04/18/2014   CREATININE 1.39* 04/18/2014   CALCIUM 9.3 04/18/2014   GFRNONAA 59* 04/18/2014   GFRAA 68 04/18/2014     Assessment and Plan  HIV disease = will need labs today, since > 6 mo. Will change him to genvoya, about elevated Cr. Also check [phone]  Proteinuria = will check ua  Health maintenance = will check std and rpr  Poor Health literacy = mild cognitive impairment from prior closed head trauma, understands new medicines  Spent 25 min with patient with greater than 50% in face to face counseling on hiv disease and adherence

## 2014-08-07 ENCOUNTER — Other Ambulatory Visit: Payer: Medicare Other

## 2014-08-07 DIAGNOSIS — B20 Human immunodeficiency virus [HIV] disease: Secondary | ICD-10-CM | POA: Diagnosis not present

## 2014-08-07 LAB — CBC WITH DIFFERENTIAL/PLATELET
Basophils Absolute: 0 10*3/uL (ref 0.0–0.1)
Basophils Relative: 1 % (ref 0–1)
EOS ABS: 0 10*3/uL (ref 0.0–0.7)
EOS PCT: 1 % (ref 0–5)
HEMATOCRIT: 42.7 % (ref 39.0–52.0)
HEMOGLOBIN: 13.9 g/dL (ref 13.0–17.0)
Lymphocytes Relative: 30 % (ref 12–46)
Lymphs Abs: 1.4 10*3/uL (ref 0.7–4.0)
MCH: 27.9 pg (ref 26.0–34.0)
MCHC: 32.6 g/dL (ref 30.0–36.0)
MCV: 85.7 fL (ref 78.0–100.0)
MPV: 9.9 fL (ref 8.6–12.4)
Monocytes Absolute: 0.2 10*3/uL (ref 0.1–1.0)
Monocytes Relative: 5 % (ref 3–12)
NEUTROS ABS: 3 10*3/uL (ref 1.7–7.7)
Neutrophils Relative %: 63 % (ref 43–77)
PLATELETS: 315 10*3/uL (ref 150–400)
RBC: 4.98 MIL/uL (ref 4.22–5.81)
RDW: 15.4 % (ref 11.5–15.5)
WBC: 4.7 10*3/uL (ref 4.0–10.5)

## 2014-08-07 LAB — COMPLETE METABOLIC PANEL WITH GFR
ALT: 38 U/L (ref 0–53)
AST: 25 U/L (ref 0–37)
Albumin: 4.1 g/dL (ref 3.5–5.2)
Alkaline Phosphatase: 157 U/L — ABNORMAL HIGH (ref 39–117)
BILIRUBIN TOTAL: 0.3 mg/dL (ref 0.2–1.2)
BUN: 12 mg/dL (ref 6–23)
CO2: 21 mEq/L (ref 19–32)
Calcium: 9.1 mg/dL (ref 8.4–10.5)
Chloride: 104 mEq/L (ref 96–112)
Creat: 1.43 mg/dL — ABNORMAL HIGH (ref 0.50–1.35)
GFR, EST AFRICAN AMERICAN: 66 mL/min
GFR, Est Non African American: 57 mL/min — ABNORMAL LOW
GLUCOSE: 76 mg/dL (ref 70–99)
POTASSIUM: 4.2 meq/L (ref 3.5–5.3)
Sodium: 139 mEq/L (ref 135–145)
TOTAL PROTEIN: 7.1 g/dL (ref 6.0–8.3)

## 2014-08-08 LAB — HIV-1 RNA QUANT-NO REFLEX-BLD
HIV 1 RNA QUANT: 29 {copies}/mL — AB (ref <20)
HIV-1 RNA QUANT, LOG: 1.46 {Log} — AB (ref <1.30)

## 2014-08-08 LAB — T-HELPER CELL (CD4) - (RCID CLINIC ONLY)
CD4 % Helper T Cell: 29 % — ABNORMAL LOW (ref 33–55)
CD4 T CELL ABS: 400 /uL (ref 400–2700)

## 2014-08-08 LAB — RPR

## 2014-08-23 DIAGNOSIS — M79604 Pain in right leg: Secondary | ICD-10-CM | POA: Diagnosis not present

## 2014-08-23 DIAGNOSIS — M79661 Pain in right lower leg: Secondary | ICD-10-CM | POA: Diagnosis not present

## 2014-08-23 DIAGNOSIS — M79662 Pain in left lower leg: Secondary | ICD-10-CM | POA: Diagnosis not present

## 2014-08-23 DIAGNOSIS — H548 Legal blindness, as defined in USA: Secondary | ICD-10-CM | POA: Diagnosis not present

## 2014-08-23 DIAGNOSIS — M79605 Pain in left leg: Secondary | ICD-10-CM | POA: Diagnosis not present

## 2014-08-23 DIAGNOSIS — B20 Human immunodeficiency virus [HIV] disease: Secondary | ICD-10-CM | POA: Diagnosis not present

## 2014-08-23 DIAGNOSIS — S8990XA Unspecified injury of unspecified lower leg, initial encounter: Secondary | ICD-10-CM | POA: Diagnosis not present

## 2014-09-08 ENCOUNTER — Other Ambulatory Visit: Payer: Medicare Other

## 2014-09-08 DIAGNOSIS — R809 Proteinuria, unspecified: Secondary | ICD-10-CM

## 2014-09-08 DIAGNOSIS — B20 Human immunodeficiency virus [HIV] disease: Secondary | ICD-10-CM | POA: Diagnosis not present

## 2014-09-09 LAB — COMPLETE METABOLIC PANEL WITH GFR
ALT: 20 U/L (ref 0–53)
AST: 15 U/L (ref 0–37)
Albumin: 3.9 g/dL (ref 3.5–5.2)
Alkaline Phosphatase: 110 U/L (ref 39–117)
BUN: 17 mg/dL (ref 6–23)
CO2: 23 meq/L (ref 19–32)
CREATININE: 1.52 mg/dL — AB (ref 0.50–1.35)
Calcium: 9.5 mg/dL (ref 8.4–10.5)
Chloride: 106 mEq/L (ref 96–112)
GFR, Est African American: 61 mL/min
GFR, Est Non African American: 53 mL/min — ABNORMAL LOW
Glucose, Bld: 105 mg/dL — ABNORMAL HIGH (ref 70–99)
Potassium: 4.2 mEq/L (ref 3.5–5.3)
Sodium: 140 mEq/L (ref 135–145)
TOTAL PROTEIN: 6.9 g/dL (ref 6.0–8.3)
Total Bilirubin: 0.4 mg/dL (ref 0.2–1.2)

## 2014-09-09 LAB — CBC WITH DIFFERENTIAL/PLATELET
Basophils Absolute: 0.1 10*3/uL (ref 0.0–0.1)
Basophils Relative: 1 % (ref 0–1)
Eosinophils Absolute: 0.1 10*3/uL (ref 0.0–0.7)
Eosinophils Relative: 1 % (ref 0–5)
HEMATOCRIT: 40.7 % (ref 39.0–52.0)
Hemoglobin: 13.3 g/dL (ref 13.0–17.0)
LYMPHS ABS: 2.2 10*3/uL (ref 0.7–4.0)
LYMPHS PCT: 35 % (ref 12–46)
MCH: 28 pg (ref 26.0–34.0)
MCHC: 32.7 g/dL (ref 30.0–36.0)
MCV: 85.7 fL (ref 78.0–100.0)
MONO ABS: 0.3 10*3/uL (ref 0.1–1.0)
MPV: 9.8 fL (ref 8.6–12.4)
Monocytes Relative: 5 % (ref 3–12)
NEUTROS PCT: 58 % (ref 43–77)
Neutro Abs: 3.6 10*3/uL (ref 1.7–7.7)
PLATELETS: 289 10*3/uL (ref 150–400)
RBC: 4.75 MIL/uL (ref 4.22–5.81)
RDW: 15.6 % — ABNORMAL HIGH (ref 11.5–15.5)
WBC: 6.2 10*3/uL (ref 4.0–10.5)

## 2014-09-10 LAB — T-HELPER CELL (CD4) - (RCID CLINIC ONLY)
CD4 % Helper T Cell: 31 % — ABNORMAL LOW (ref 33–55)
CD4 T Cell Abs: 670 /uL (ref 400–2700)

## 2014-09-10 LAB — HIV-1 RNA QUANT-NO REFLEX-BLD: HIV-1 RNA Quant, Log: <1.3 {Log} (ref <1.30)

## 2014-09-13 LAB — HLA B*5701: HLA-B*5701 w/rflx HLA-B High: NEGATIVE

## 2014-09-18 ENCOUNTER — Telehealth: Payer: Self-pay | Admitting: *Deleted

## 2014-09-18 ENCOUNTER — Ambulatory Visit: Payer: Medicare Other | Admitting: Internal Medicine

## 2014-09-18 NOTE — Telephone Encounter (Signed)
Called the patient mother about his missed appt and she advised they got it mixed up. She advised they just moved to [facility] and she has had several appt for herself and was confused. She asked if I could call back next week and reschedule her as she could not write anything down right now.

## 2014-10-31 ENCOUNTER — Other Ambulatory Visit: Payer: Self-pay | Admitting: Internal Medicine

## 2014-11-03 ENCOUNTER — Other Ambulatory Visit: Payer: Self-pay | Admitting: *Deleted

## 2014-11-03 DIAGNOSIS — E785 Hyperlipidemia, unspecified: Secondary | ICD-10-CM

## 2014-11-03 MED ORDER — ATORVASTATIN CALCIUM 20 MG PO TABS: ORAL_TABLET | ORAL | Status: DC

## 2014-11-03 NOTE — Telephone Encounter (Signed)
[facility]

## 2014-11-06 ENCOUNTER — Ambulatory Visit (INDEPENDENT_AMBULATORY_CARE_PROVIDER_SITE_OTHER): Payer: Medicare Other | Admitting: Internal Medicine

## 2014-11-06 ENCOUNTER — Encounter: Payer: Self-pay | Admitting: Internal Medicine

## 2014-11-06 VITALS — BP 110/62 | HR 78 | Temp 98.4°F | Wt 157.0 lb

## 2014-11-06 DIAGNOSIS — Z23 Encounter for immunization: Secondary | ICD-10-CM | POA: Diagnosis not present

## 2014-11-06 DIAGNOSIS — H547 Unspecified visual loss: Secondary | ICD-10-CM

## 2014-11-06 DIAGNOSIS — R739 Hyperglycemia, unspecified: Secondary | ICD-10-CM | POA: Diagnosis not present

## 2014-11-06 DIAGNOSIS — B20 Human immunodeficiency virus [HIV] disease: Secondary | ICD-10-CM | POA: Diagnosis not present

## 2014-11-06 DIAGNOSIS — F329 Major depressive disorder, single episode, unspecified: Secondary | ICD-10-CM

## 2014-11-06 DIAGNOSIS — I10 Essential (primary) hypertension: Secondary | ICD-10-CM

## 2014-11-06 DIAGNOSIS — Z1211 Encounter for screening for malignant neoplasm of colon: Secondary | ICD-10-CM

## 2014-11-06 DIAGNOSIS — E785 Hyperlipidemia, unspecified: Secondary | ICD-10-CM

## 2014-11-06 DIAGNOSIS — F32A Depression, unspecified: Secondary | ICD-10-CM

## 2014-11-06 MED ORDER — CITALOPRAM HYDROBROMIDE 40 MG PO TABS
40.0000 mg | ORAL_TABLET | Freq: Every day | ORAL | Status: DC
Start: 2014-11-06 — End: 2017-05-11

## 2014-11-06 MED ORDER — TETANUS-DIPHTH-ACELL PERTUSSIS 5-2.5-18.5 LF-MCG/0.5 IM SUSP: 0.5000 mL | Freq: Once | INTRAMUSCULAR | Status: DC
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
## 2014-11-06 NOTE — Addendum Note (Signed)
Addended by: Particia Lather C on: 11/06/2014 05:00 PM   Modules accepted: Orders

## 2014-11-07 ENCOUNTER — Encounter: Payer: Self-pay | Admitting: Gastroenterology

## 2014-11-10 DIAGNOSIS — R55 Syncope and collapse: Secondary | ICD-10-CM | POA: Diagnosis not present

## 2014-11-10 DIAGNOSIS — R404 Transient alteration of awareness: Secondary | ICD-10-CM | POA: Diagnosis not present

## 2014-11-15 ENCOUNTER — Encounter: Payer: Self-pay | Admitting: Internal Medicine

## 2015-01-14 ENCOUNTER — Encounter: Payer: Medicare Other | Admitting: Gastroenterology

## 2015-01-15 ENCOUNTER — Ambulatory Visit: Payer: Medicare Other | Admitting: Internal Medicine

## 2015-02-06 ENCOUNTER — Ambulatory Visit: Payer: Medicare Other | Admitting: Internal Medicine

## 2015-02-09 ENCOUNTER — Ambulatory Visit: Payer: Medicare Other | Admitting: Internal Medicine

## 2015-02-12 ENCOUNTER — Ambulatory Visit (INDEPENDENT_AMBULATORY_CARE_PROVIDER_SITE_OTHER): Payer: Medicare Other | Admitting: Internal Medicine

## 2015-02-12 ENCOUNTER — Encounter: Payer: Self-pay | Admitting: Internal Medicine

## 2015-02-12 VITALS — BP 118/60 | HR 69 | Temp 98.2°F | Ht 64.0 in | Wt 160.0 lb

## 2015-02-12 DIAGNOSIS — R739 Hyperglycemia, unspecified: Secondary | ICD-10-CM

## 2015-02-12 DIAGNOSIS — F32A Depression, unspecified: Secondary | ICD-10-CM

## 2015-02-12 DIAGNOSIS — B20 Human immunodeficiency virus [HIV] disease: Secondary | ICD-10-CM

## 2015-02-12 DIAGNOSIS — Z23 Encounter for immunization: Secondary | ICD-10-CM

## 2015-02-12 DIAGNOSIS — F918 Other conduct disorders: Secondary | ICD-10-CM | POA: Diagnosis not present

## 2015-02-12 DIAGNOSIS — I10 Essential (primary) hypertension: Secondary | ICD-10-CM | POA: Diagnosis not present

## 2015-02-12 DIAGNOSIS — F329 Major depressive disorder, single episode, unspecified: Secondary | ICD-10-CM

## 2015-02-12 DIAGNOSIS — H547 Unspecified visual loss: Secondary | ICD-10-CM | POA: Diagnosis not present

## 2015-02-12 DIAGNOSIS — Z1211 Encounter for screening for malignant neoplasm of colon: Secondary | ICD-10-CM

## 2015-02-12 DIAGNOSIS — R4689 Other symptoms and signs involving appearance and behavior: Secondary | ICD-10-CM

## 2015-02-12 MED ORDER — TETANUS-DIPHTH-ACELL PERTUSSIS 5-2.5-18.5 LF-MCG/0.5 IM SUSP: 0.5000 mL | Freq: Once | INTRAMUSCULAR | Status: DC
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
## 2015-02-16 ENCOUNTER — Other Ambulatory Visit: Payer: Medicare Other

## 2015-05-02 ENCOUNTER — Other Ambulatory Visit: Payer: Self-pay | Admitting: Internal Medicine

## 2015-05-18 ENCOUNTER — Ambulatory Visit: Payer: Medicare Other | Admitting: Internal Medicine

## 2015-06-01 ENCOUNTER — Other Ambulatory Visit: Payer: Self-pay | Admitting: Internal Medicine

## 2015-06-01 ENCOUNTER — Ambulatory Visit: Payer: Medicare Other | Admitting: Internal Medicine

## 2015-06-03 ENCOUNTER — Other Ambulatory Visit: Payer: Medicare Other

## 2015-06-04 ENCOUNTER — Ambulatory Visit: Payer: Medicare Other | Admitting: Internal Medicine

## 2015-06-04 ENCOUNTER — Encounter: Payer: Self-pay | Admitting: Internal Medicine

## 2015-07-31 ENCOUNTER — Other Ambulatory Visit: Payer: Self-pay | Admitting: Internal Medicine

## 2015-08-10 ENCOUNTER — Ambulatory Visit: Payer: Medicare Other | Admitting: Internal Medicine

## 2015-10-03 ENCOUNTER — Other Ambulatory Visit: Payer: Self-pay | Admitting: Internal Medicine

## 2016-01-31 ENCOUNTER — Other Ambulatory Visit: Payer: Self-pay | Admitting: Internal Medicine

## 2016-02-11 ENCOUNTER — Encounter: Payer: Self-pay | Admitting: Internal Medicine

## 2016-02-11 ENCOUNTER — Ambulatory Visit (INDEPENDENT_AMBULATORY_CARE_PROVIDER_SITE_OTHER): Payer: Medicare Other | Admitting: Internal Medicine

## 2016-02-11 ENCOUNTER — Ambulatory Visit (INDEPENDENT_AMBULATORY_CARE_PROVIDER_SITE_OTHER): Payer: Medicare Other

## 2016-02-11 VITALS — BP 110/66 | HR 85 | Temp 97.4°F | Ht 64.0 in | Wt 162.0 lb

## 2016-02-11 DIAGNOSIS — R739 Hyperglycemia, unspecified: Secondary | ICD-10-CM

## 2016-02-11 DIAGNOSIS — H547 Unspecified visual loss: Secondary | ICD-10-CM | POA: Diagnosis not present

## 2016-02-11 DIAGNOSIS — K089 Disorder of teeth and supporting structures, unspecified: Secondary | ICD-10-CM

## 2016-02-11 DIAGNOSIS — E785 Hyperlipidemia, unspecified: Secondary | ICD-10-CM | POA: Diagnosis not present

## 2016-02-11 DIAGNOSIS — B20 Human immunodeficiency virus [HIV] disease: Secondary | ICD-10-CM | POA: Diagnosis not present

## 2016-02-11 DIAGNOSIS — Z87828 Personal history of other (healed) physical injury and trauma: Secondary | ICD-10-CM | POA: Diagnosis not present

## 2016-02-11 DIAGNOSIS — Z5181 Encounter for therapeutic drug level monitoring: Secondary | ICD-10-CM

## 2016-02-11 DIAGNOSIS — Z7251 High risk heterosexual behavior: Secondary | ICD-10-CM | POA: Diagnosis not present

## 2016-02-11 DIAGNOSIS — K219 Gastro-esophageal reflux disease without esophagitis: Secondary | ICD-10-CM | POA: Diagnosis not present

## 2016-02-11 DIAGNOSIS — Z0000 Encounter for general adult medical examination without abnormal findings: Secondary | ICD-10-CM

## 2016-02-11 DIAGNOSIS — Z1211 Encounter for screening for malignant neoplasm of colon: Secondary | ICD-10-CM | POA: Diagnosis not present

## 2016-02-11 DIAGNOSIS — Z23 Encounter for immunization: Secondary | ICD-10-CM

## 2016-02-11 LAB — CBC WITH DIFFERENTIAL/PLATELET
Basophils Absolute: 66 cells/uL (ref 0–200)
Basophils Relative: 1 %
Eosinophils Absolute: 132 cells/uL (ref 15–500)
Eosinophils Relative: 2 %
HCT: 41.7 % (ref 38.5–50.0)
Hemoglobin: 13.6 g/dL (ref 13.2–17.1)
Lymphocytes Relative: 24 %
Lymphs Abs: 1584 cells/uL (ref 850–3900)
MCH: 29.1 pg (ref 27.0–33.0)
MCHC: 32.6 g/dL (ref 32.0–36.0)
MCV: 89.3 fL (ref 80.0–100.0)
MPV: 10.4 fL (ref 7.5–12.5)
Monocytes Absolute: 330 cells/uL (ref 200–950)
Monocytes Relative: 5 %
Neutro Abs: 4488 cells/uL (ref 1500–7800)
Neutrophils Relative %: 68 %
Platelets: 219 10*3/uL (ref 140–400)
RBC: 4.67 MIL/uL (ref 4.20–5.80)
RDW: 14.5 % (ref 11.0–15.0)
WBC: 6.6 10*3/uL (ref 3.8–10.8)

## 2016-02-11 LAB — COMPLETE METABOLIC PANEL WITH GFR
ALT: 9 U/L (ref 9–46)
AST: 15 U/L (ref 10–35)
Albumin: 4.2 g/dL (ref 3.6–5.1)
Alkaline Phosphatase: 90 U/L (ref 40–115)
BUN: 23 mg/dL (ref 7–25)
CO2: 20 mmol/L (ref 20–31)
Calcium: 9.2 mg/dL (ref 8.6–10.3)
Chloride: 111 mmol/L — ABNORMAL HIGH (ref 98–110)
Creat: 1.53 mg/dL — ABNORMAL HIGH (ref 0.70–1.33)
GFR, Est African American: 60 mL/min (ref >=60)
GFR, Est Non African American: 52 mL/min — ABNORMAL LOW (ref >=60)
Glucose, Bld: 81 mg/dL (ref 65–99)
Potassium: 4.5 mmol/L (ref 3.5–5.3)
Sodium: 143 mmol/L (ref 135–146)
Total Bilirubin: 0.4 mg/dL (ref 0.2–1.2)
Total Protein: 7.5 g/dL (ref 6.1–8.1)

## 2016-02-11 LAB — LIPID PANEL
Cholesterol: 219 mg/dL — ABNORMAL HIGH (ref <200)
HDL: 71 mg/dL (ref >40)
LDL Cholesterol: 133 mg/dL — ABNORMAL HIGH (ref <100)
Total CHOL/HDL Ratio: 3.1 Ratio (ref <5.0)
Triglycerides: 75 mg/dL (ref <150)
VLDL: 15 mg/dL (ref <30)

## 2016-02-11 LAB — HEMOGLOBIN A1C
Hgb A1c MFr Bld: 5.4 % (ref <5.7)
Mean Plasma Glucose: 108 mg/dL

## 2016-02-11 NOTE — Patient Instructions (Signed)
Isaac Miller , Thank you for taking time to come for your Medicare Wellness Visit. I appreciate your ongoing commitment to your health goals. Please review the following plan we discussed and let me know if I can assist you in the future.   These are the goals we discussed: Goals    None      This is a list of the screening recommended for you and due dates:  Health Maintenance  Topic Date Due  . Tetanus Vaccine  08/31/1983  . Colon Cancer Screening  08/31/2014  . Flu Shot  11/03/2015  . HIV Screening  Completed  Preventive Care for Adults  A healthy lifestyle and preventive care can promote health and wellness. Preventive health guidelines for adults include the following key practices.  . A routine yearly physical is a good way to check with your health care provider about your health and preventive screening. It is a chance to share any concerns and updates on your health and to receive a thorough exam.  . Visit your dentist for a routine exam and preventive care every 6 months. Brush your teeth twice a day and floss once a day. Good oral hygiene prevents tooth decay and gum disease.  . The frequency of eye exams is based on your age, health, family medical history, use  of contact lenses, and other factors. Follow your health care provider's ecommendations for frequency of eye exams.  . Eat a healthy diet. Foods like vegetables, fruits, whole grains, low-fat dairy products, and lean protein foods contain the nutrients you need without too many calories. Decrease your intake of foods high in solid fats, added sugars, and salt. Eat the right amount of calories for you. Get information about a proper diet from your health care provider, if necessary.  . Regular physical exercise is one of the most important things you can do for your health. Most adults should get at least 150 minutes of moderate-intensity exercise (any activity that increases your heart rate and causes you to sweat)  each week. In addition, most adults need muscle-strengthening exercises on 2 or more days a week.  Silver Sneakers may be a benefit available to you. To determine eligibility, you may visit the website: www.silversneakers.com or contact program at [phone] Mon-Fri between 8AM-8PM.   . Maintain a healthy weight. The body mass index (BMI) is a screening tool to identify possible weight problems. It provides an estimate of body fat based on height and weight. Your health care provider can find your BMI and can help you achieve or maintain a healthy weight.   For adults 20 years and older: ? A BMI below 18.5 is considered underweight. ? A BMI of 18.5 to 24.9 is normal. ? A BMI of 25 to 29.9 is considered overweight. ? A BMI of 30 and above is considered obese.   . Maintain normal blood lipids and cholesterol levels by exercising and minimizing your intake of saturated fat. Eat a balanced diet with plenty of fruit and vegetables. Blood tests for lipids and cholesterol should begin at age 51 and be repeated every 5 years. If your lipid or cholesterol levels are high, you are over 50, or you are at high risk for heart disease, you may need your cholesterol levels checked more frequently. Ongoing high lipid and cholesterol levels should be treated with medicines if diet and exercise are not working.  . If you smoke, find out from your health care provider how to quit. If you do  not use tobacco, please do not start.  . If you choose to drink alcohol, please do not consume more than 2 drinks per day. One drink is considered to be 12 ounces (355 mL) of beer, 5 ounces (148 mL) of wine, or 1.5 ounces (44 mL) of liquor.  . If you are 5055-51 years old, ask your health care provider if you should take aspirin to prevent strokes.  . Use sunscreen. Apply sunscreen liberally and repeatedly throughout the day. You should seek shade when your shadow is shorter than you. Protect yourself by wearing long sleeves,  pants, a wide-brimmed hat, and sunglasses year round, whenever you are outdoors.  . Once a month, do a whole body skin exam, using a mirror to look at the skin on your back. Tell your health care provider of new moles, moles that have irregular borders, moles that are larger than a pencil eraser, or moles that have changed in shape or color.
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
## 2016-02-12 ENCOUNTER — Other Ambulatory Visit: Payer: Self-pay

## 2016-02-12 LAB — T-HELPER CELLS (CD4) COUNT (NOT AT ARMC)
Absolute CD4: 545 cells/uL (ref 381–1469)
CD4 T Helper %: 36 % (ref 32–62)
Total lymphocyte count: 1528 cells/uL (ref 700–3300)

## 2016-02-12 LAB — GC/CHLAMYDIA PROBE AMP
CT Probe RNA: NOT DETECTED
GC Probe RNA: NOT DETECTED

## 2016-02-12 LAB — RPR

## 2016-02-12 MED ORDER — ATORVASTATIN CALCIUM 40 MG PO TABS: 40.0000 mg | ORAL_TABLET | Freq: Every day | ORAL | 1 refills | Status: DC

## 2016-02-15 ENCOUNTER — Telehealth: Payer: Self-pay | Admitting: Internal Medicine

## 2016-02-15 NOTE — Telephone Encounter (Signed)
left message asking pt to call me at [phone] about community resource referral. VDM (DD)

## 2016-02-16 ENCOUNTER — Telehealth: Payer: Self-pay | Admitting: *Deleted

## 2016-02-16 ENCOUNTER — Ambulatory Visit: Payer: Medicare Other

## 2016-02-16 NOTE — Telephone Encounter (Signed)
It was a gc/chlamydia probe urine test that ID wanted him to have.  The order is in there.

## 2016-02-16 NOTE — Telephone Encounter (Signed)
[name] with [facility] called and stated that they received a Urine for patient but no order. I reviewed last OV note but nothing stated. Were you wanting to order anything with Urine for patient? Please Advise.

## 2016-02-16 NOTE — Telephone Encounter (Signed)
Isaac Miller with [facility] notified, she stated it was fine to leave message on her machine.

## 2016-02-18 NOTE — Telephone Encounter (Signed)
I left another message asking pt to call me about community resource referral. VDM (DD)

## 2016-02-19 LAB — HIV-1 RNA ULTRAQUANT REFLEX TO GENTYP+

## 2016-02-23 ENCOUNTER — Encounter: Payer: Self-pay | Admitting: Internal Medicine

## 2016-03-01 ENCOUNTER — Encounter: Payer: Self-pay | Admitting: Internal Medicine

## 2016-03-02 ENCOUNTER — Encounter: Payer: Self-pay | Admitting: Internal Medicine

## 2016-03-03 NOTE — Telephone Encounter (Signed)
left message on 03/03/16 asking pt to call me at [phone] about community resource referral. VDM (DD)

## 2016-03-15 ENCOUNTER — Ambulatory Visit (INDEPENDENT_AMBULATORY_CARE_PROVIDER_SITE_OTHER): Payer: Medicare Other | Admitting: Internal Medicine

## 2016-03-15 ENCOUNTER — Encounter: Payer: Self-pay | Admitting: Internal Medicine

## 2016-03-15 VITALS — BP 118/84 | HR 80 | Temp 98.6°F | Wt 162.0 lb

## 2016-03-15 DIAGNOSIS — B20 Human immunodeficiency virus [HIV] disease: Secondary | ICD-10-CM

## 2016-03-15 DIAGNOSIS — Z23 Encounter for immunization: Secondary | ICD-10-CM | POA: Diagnosis not present

## 2016-03-15 DIAGNOSIS — K219 Gastro-esophageal reflux disease without esophagitis: Secondary | ICD-10-CM

## 2016-03-15 DIAGNOSIS — E7800 Pure hypercholesterolemia, unspecified: Secondary | ICD-10-CM

## 2016-03-15 LAB — CBC WITH DIFFERENTIAL/PLATELET
BASOS ABS: 57 {cells}/uL (ref 0–200)
Basophils Relative: 1 %
EOS ABS: 57 {cells}/uL (ref 15–500)
EOS PCT: 1 %
HCT: 40.4 % (ref 38.5–50.0)
HEMOGLOBIN: 13.1 g/dL — AB (ref 13.2–17.1)
LYMPHS ABS: 1767 {cells}/uL (ref 850–3900)
Lymphocytes Relative: 31 %
MCH: 27.8 pg (ref 27.0–33.0)
MCHC: 32.4 g/dL (ref 32.0–36.0)
MCV: 85.8 fL (ref 80.0–100.0)
MPV: 10 fL (ref 7.5–12.5)
Monocytes Absolute: 285 cells/uL (ref 200–950)
Monocytes Relative: 5 %
NEUTROS ABS: 3534 {cells}/uL (ref 1500–7800)
Neutrophils Relative %: 62 %
Platelets: 264 10*3/uL (ref 140–400)
RBC: 4.71 MIL/uL (ref 4.20–5.80)
RDW: 14.3 % (ref 11.0–15.0)
WBC: 5.7 10*3/uL (ref 3.8–10.8)

## 2016-03-15 MED ORDER — ESOMEPRAZOLE MAGNESIUM 40 MG PO PACK: 40.0000 mg | PACK | Freq: Every day | ORAL | 12 refills | Status: DC

## 2016-03-15 NOTE — Progress Notes (Signed)
Rfv: follow up for hiv disease  Patient ID: Isaac Miller, male   DOB: 05-24-1965, 50 y.o.   MRN: [id number]  HPI Mr Isaac Miller is a 50yo M with HIV disease, baseline cognitive impairment, CD 4 count of 670/VL<20 on genvoya. He reports doing well overall, no recent illnesses. His care provider, states he needs refills on his meds   Outpatient Encounter Prescriptions as of 03/15/2016  Medication Sig  . atorvastatin (LIPITOR) 40 MG tablet Take 1 tablet (40 mg total) by mouth daily.  Marland Kitchen. buPROPion (WELLBUTRIN SR) 150 MG 12 hr tablet TAKE 1 TABLET(150 MG) BY MOUTH TWICE DAILY  . citalopram (CELEXA) 40 MG tablet Take 1 tablet (40 mg total) by mouth daily.  . GENVOYA 150-150-200-10 MG TABS tablet TAKE 1 TABLET BY MOUTH DAILY WITH BREAKFAST  . hydrochlorothiazide (HYDRODIURIL) 25 MG tablet TAKE 1 TABLET BY MOUTH DAILY  . NEXIUM 40 MG capsule TAKE 1 CAPSULE BY MOUTH EVERY DAY   No facility-administered encounter medications on file as of 03/15/2016.      Patient Active Problem List   Diagnosis Date Noted  . GERD (gastroesophageal reflux disease) 06/25/2014  . Hyperlipidemia with target LDL less than 100 07/23/2012  . Hyperglycemia 07/23/2012  . Medication monitoring encounter 10/08/2010  . FRACTURE NOS, CLOSED 10/24/2006  . History of traumatic head injury 10/24/2006  . Human immunodeficiency virus (HIV) disease (HCC) 09/18/2006     There are no preventive care reminders to display for this patient.   Review of Systems Review of Systems  Constitutional: Negative for fever, chills, diaphoresis, activity change, appetite change, fatigue and unexpected weight change.  HENT: Negative for congestion, sore throat, rhinorrhea, sneezing, trouble swallowing and sinus pressure.  Eyes: Negative for photophobia and visual disturbance.  Respiratory: Negative for cough, chest tightness, shortness of breath, wheezing and stridor.  Cardiovascular: Negative for chest pain, palpitations and leg  swelling.  Gastrointestinal: Negative for nausea, vomiting, abdominal pain, diarrhea, constipation, blood in stool, abdominal distention and anal bleeding.  Genitourinary: Negative for dysuria, hematuria, flank pain and difficulty urinating.  Musculoskeletal: Negative for myalgias, back pain, joint swelling, arthralgias and gait problem.  Skin: Negative for color change, pallor, rash and wound.  Neurological: Negative for dizziness, tremors, weakness and light-headedness.  Hematological: Negative for adenopathy. Does not bruise/bleed easily.  Psychiatric/Behavioral: Negative for behavioral problems, confusion, sleep disturbance, dysphoric mood, decreased concentration and agitation.    Physical Exam   BP 118/84   Pulse 80   Temp 98.6 F (37 C) (Oral)   Wt 162 lb (73.5 kg)   BMI 27.81 kg/m  Physical Exam  Constitutional: He is oriented to person, place, and time. He appears well-developed and well-nourished. No distress.  HENT:  Mouth/Throat: Oropharynx is clear and moist. No oropharyngeal exudate.  Cardiovascular: Normal rate, regular rhythm and normal heart sounds. Exam reveals no gallop and no friction rub.  No murmur heard.  Pulmonary/Chest: Effort normal and breath sounds normal. No respiratory distress. He has no wheezes.  Abdominal: Soft. Bowel sounds are normal. He exhibits no distension. There is no tenderness.  Lymphadenopathy:  He has no cervical adenopathy.  Neurological: He is alert and oriented to person, place, and time.  Skin: Skin is warm and dry. No rash noted. No erythema.  Psychiatric: He has a normal mood and affect. His behavior is normal.    Lab Results  Component Value Date   CD4TCELL 36 02/11/2016   Lab Results  Component Value Date   CD4TABS 670 09/08/2014  CD4TABS 400 08/07/2014   CD4TABS 490 10/30/2013   Lab Results  Component Value Date   HIV1RNAQUANT SEE NOTE 02/11/2016   Lab Results  Component Value Date   HEPBSAB POS (A) 12/17/2013    No results found for: RPR  CBC Lab Results  Component Value Date   WBC 6.6 02/11/2016   RBC 4.67 02/11/2016   HGB 13.6 02/11/2016   HCT 41.7 02/11/2016   PLT 219 02/11/2016   MCV 89.3 02/11/2016   MCH 29.1 02/11/2016   MCHC 32.6 02/11/2016   RDW 14.5 02/11/2016   LYMPHSABS 1,584 02/11/2016   MONOABS 330 02/11/2016   EOSABS 132 02/11/2016   BASOSABS 66 02/11/2016   BMET Lab Results  Component Value Date   NA 143 02/11/2016   K 4.5 02/11/2016   CL 111 (H) 02/11/2016   CO2 20 02/11/2016   GLUCOSE 81 02/11/2016   BUN 23 02/11/2016   CREATININE 1.53 (H) 02/11/2016   CALCIUM 9.2 02/11/2016   GFRNONAA 52 (L) 02/11/2016   GFRAA 60 02/11/2016     Assessment and Plan  hiv disease = will refill genvoya, but plan to get labs today  ckd 3= appears stable   Hypercholesteremia = currently on artovastatin  Gerd= will refill nexium to use as needed  Health maintenance = will give him meningococcal today  rtc in 6 months

## 2016-03-16 ENCOUNTER — Encounter: Payer: Self-pay | Admitting: Internal Medicine

## 2016-03-16 LAB — T-HELPER CELL (CD4) - (RCID CLINIC ONLY)
CD4 T CELL HELPER: 33 % (ref 33–55)
CD4 T Cell Abs: 640 /uL (ref 400–2700)

## 2016-03-16 LAB — RPR

## 2016-03-16 LAB — HEPATITIS C ANTIBODY: HCV AB: NEGATIVE

## 2016-03-17 ENCOUNTER — Telehealth: Payer: Self-pay | Admitting: Internal Medicine

## 2016-03-17 LAB — HIV-1 RNA QUANT-NO REFLEX-BLD
HIV 1 RNA Quant: <20 copies/mL (ref <20)
HIV-1 RNA Quant, Log: <1.3 Log copies/mL (ref <1.30)

## 2016-03-17 NOTE — Telephone Encounter (Signed)
left msg asking pt to call me about referrals. VDM (DD)

## 2016-03-17 NOTE — Addendum Note (Signed)
Addended by: Lurlean LeydenPOOLE,  F on: 03/17/2016 09:59 AM   Modules accepted: Orders

## 2016-03-25 ENCOUNTER — Telehealth: Payer: Self-pay | Admitting: Internal Medicine

## 2016-03-25 NOTE — Telephone Encounter (Signed)
I called and left a message asking if SCAT application has been received in mail.  I also asked if pt has an eye doctor that he sees already.

## 2016-04-06 ENCOUNTER — Encounter: Payer: Self-pay | Admitting: Internal Medicine

## 2016-04-19 ENCOUNTER — Other Ambulatory Visit: Payer: Self-pay | Admitting: Internal Medicine

## 2016-06-15 ENCOUNTER — Encounter: Payer: Self-pay | Admitting: Gastroenterology

## 2016-08-01 ENCOUNTER — Other Ambulatory Visit: Payer: Self-pay | Admitting: Internal Medicine

## 2016-08-02 ENCOUNTER — Other Ambulatory Visit: Payer: Self-pay | Admitting: Internal Medicine

## 2016-08-02 NOTE — Telephone Encounter (Signed)
Refilled completed already

## 2016-08-04 ENCOUNTER — Encounter: Payer: Medicare Other | Admitting: Gastroenterology

## 2016-08-09 ENCOUNTER — Other Ambulatory Visit: Payer: Self-pay | Admitting: Internal Medicine

## 2016-08-30 ENCOUNTER — Other Ambulatory Visit: Payer: Self-pay | Admitting: Internal Medicine

## 2016-08-31 NOTE — Telephone Encounter (Signed)
Left message on voicemail for patient to return call when available, reason for call: Patient needs to schedule an appointment. Patient was to follow-up in February 2018.

## 2016-09-08 ENCOUNTER — Other Ambulatory Visit: Payer: Self-pay | Admitting: Internal Medicine

## 2016-09-13 ENCOUNTER — Ambulatory Visit: Payer: Medicare Other | Admitting: Internal Medicine

## 2016-10-04 ENCOUNTER — Ambulatory Visit: Payer: Medicare Other | Admitting: Internal Medicine

## 2016-10-08 ENCOUNTER — Other Ambulatory Visit: Payer: Self-pay | Admitting: Internal Medicine

## 2016-11-03 ENCOUNTER — Ambulatory Visit (INDEPENDENT_AMBULATORY_CARE_PROVIDER_SITE_OTHER): Payer: Medicare Other | Admitting: Internal Medicine

## 2016-11-03 ENCOUNTER — Encounter: Payer: Self-pay | Admitting: Internal Medicine

## 2016-11-03 VITALS — BP 129/85 | HR 69 | Temp 98.3°F | Ht 66.0 in | Wt 157.0 lb

## 2016-11-03 DIAGNOSIS — B20 Human immunodeficiency virus [HIV] disease: Secondary | ICD-10-CM | POA: Diagnosis not present

## 2016-11-03 DIAGNOSIS — Z79899 Other long term (current) drug therapy: Secondary | ICD-10-CM

## 2016-11-03 DIAGNOSIS — Z0000 Encounter for general adult medical examination without abnormal findings: Secondary | ICD-10-CM | POA: Diagnosis not present

## 2016-11-03 LAB — CBC WITH DIFFERENTIAL/PLATELET
BASOS PCT: 0 %
Basophils Absolute: 0 cells/uL (ref 0–200)
EOS PCT: 1 %
Eosinophils Absolute: 60 cells/uL (ref 15–500)
HEMATOCRIT: 42.3 % (ref 38.5–50.0)
HEMOGLOBIN: 13.7 g/dL (ref 13.2–17.1)
LYMPHS ABS: 1440 {cells}/uL (ref 850–3900)
Lymphocytes Relative: 24 %
MCH: 28 pg (ref 27.0–33.0)
MCHC: 32.4 g/dL (ref 32.0–36.0)
MCV: 86.5 fL (ref 80.0–100.0)
MONO ABS: 360 {cells}/uL (ref 200–950)
MPV: 10 fL (ref 7.5–12.5)
Monocytes Relative: 6 %
Neutro Abs: 4140 cells/uL (ref 1500–7800)
Neutrophils Relative %: 69 %
Platelets: 232 10*3/uL (ref 140–400)
RBC: 4.89 MIL/uL (ref 4.20–5.80)
RDW: 15.1 % — AB (ref 11.0–15.0)
WBC: 6 10*3/uL (ref 3.8–10.8)

## 2016-11-03 LAB — COMPLETE METABOLIC PANEL WITH GFR
ALT: 16 U/L (ref 9–46)
AST: 16 U/L (ref 10–35)
Albumin: 4.2 g/dL (ref 3.6–5.1)
Alkaline Phosphatase: 100 U/L (ref 40–115)
BUN: 18 mg/dL (ref 7–25)
CALCIUM: 9.3 mg/dL (ref 8.6–10.3)
CHLORIDE: 106 mmol/L (ref 98–110)
CO2: 21 mmol/L (ref 20–31)
CREATININE: 1.8 mg/dL — AB (ref 0.70–1.33)
GFR, Est African American: 49 mL/min — ABNORMAL LOW (ref >=60)
GFR, Est Non African American: 42 mL/min — ABNORMAL LOW (ref >=60)
GLUCOSE: 76 mg/dL (ref 65–99)
POTASSIUM: 4.1 mmol/L (ref 3.5–5.3)
SODIUM: 139 mmol/L (ref 135–146)
Total Bilirubin: 0.3 mg/dL (ref 0.2–1.2)
Total Protein: 7.2 g/dL (ref 6.1–8.1)

## 2016-11-03 MED ORDER — KETOTIFEN FUMARATE 0.025 % OP SOLN: 1.0000 [drp] | Freq: Two times a day (BID) | OPHTHALMIC | 0 refills | Status: DC

## 2016-11-03 NOTE — Progress Notes (Signed)
Patient ID: Isaac FolksDavid W Miller, male   DOB: [birth year], 51 y.o.   MRN: [id number]  HPI 51yo M with hiv disease, CD 4 count on genvoya, 640/VL<20 in dec 2017. He reports having good adherence. Not missing doses. Doing well with his health, no issues since we last saw him in December  ROS: He bit his tongue a few days ago that is improving Injected eyes, itching recently othewise 12 point ros is negative  Soc hx: no smoking or alcohol. Not sexually active  Outpatient Encounter Prescriptions as of 11/03/2016  Medication Sig  . atorvastatin (LIPITOR) 40 MG tablet TAKE 1 TABLET BY MOUTH DAILY  . esomeprazole (NEXIUM) 40 MG packet Take 40 mg by mouth daily before breakfast.  . GENVOYA 150-150-200-10 MG TABS tablet TAKE 1 TABLET BY MOUTH DAILY WITH BREAKFAST  . buPROPion (WELLBUTRIN SR) 150 MG 12 hr tablet TAKE 1 TABLET BY MOUTH TWICE DAILY (Patient not taking: Reported on 11/03/2016)  . citalopram (CELEXA) 40 MG tablet Take 1 tablet (40 mg total) by mouth daily. (Patient not taking: Reported on 11/03/2016)   No facility-administered encounter medications on file as of 11/03/2016.      Patient Active Problem List   Diagnosis Date Noted  . GERD (gastroesophageal reflux disease) 06/25/2014  . Hyperlipidemia with target LDL less than 100 07/23/2012  . Hyperglycemia 07/23/2012  . Medication monitoring encounter 10/08/2010  . FRACTURE NOS, CLOSED 10/24/2006  . History of traumatic head injury 10/24/2006  . Human immunodeficiency virus (HIV) disease (HCC) 09/18/2006     Health Maintenance Due  Topic Date Due  . INFLUENZA VACCINE  11/02/2016      Physical Exam   BP 129/85   Pulse 69   Temp 98.3 F (36.8 C) (Other (Comment))   Ht 5\' 6"  (1.676 m)   Wt 157 lb (71.2 kg)   BMI 25.34 kg/m   Physical Exam  Constitutional: He is oriented to person, place, and time. He appears well-developed and well-nourished. No distress.  HENT:  Mouth/Throat: Oropharynx is clear and moist. No  oropharyngeal exudate.  Cardiovascular: Normal rate, regular rhythm and normal heart sounds. Exam reveals no gallop and no friction rub.  No murmur heard.  Pulmonary/Chest: Effort normal and breath sounds normal. No respiratory distress. He has no wheezes.  Abdominal: Soft. Bowel sounds are normal. He exhibits no distension. There is no tenderness.  Lymphadenopathy:  He has no cervical adenopathy.  Neurological: He is alert and oriented to person, place, and time.  Skin: Skin is warm and dry. No rash noted. No erythema.  Psychiatric: He has a normal mood and affect. His behavior is normal.    Lab Results  Component Value Date   CD4TCELL 33 03/15/2016   Lab Results  Component Value Date   CD4TABS 640 03/15/2016   CD4TABS 670 09/08/2014   CD4TABS 400 08/07/2014   Lab Results  Component Value Date   HIV1RNAQUANT <20 03/15/2016   Lab Results  Component Value Date   HEPBSAB POS (A) 12/17/2013   Lab Results  Component Value Date   LABRPR NON REAC 03/15/2016    CBC Lab Results  Component Value Date   WBC 5.7 03/15/2016   RBC 4.71 03/15/2016   HGB 13.1 (L) 03/15/2016   HCT 40.4 03/15/2016   PLT 264 03/15/2016   MCV 85.8 03/15/2016   MCH 27.8 03/15/2016   MCHC 32.4 03/15/2016   RDW 14.3 03/15/2016   LYMPHSABS 1,767 03/15/2016   MONOABS 285 03/15/2016   EOSABS  57 03/15/2016    BMET Lab Results  Component Value Date   NA 143 02/11/2016   K 4.5 02/11/2016   CL 111 (H) 02/11/2016   CO2 20 02/11/2016   GLUCOSE 81 02/11/2016   BUN 23 02/11/2016   CREATININE 1.53 (H) 02/11/2016   CALCIUM 9.2 02/11/2016   GFRNONAA 52 (L) 02/11/2016   GFRAA 60 02/11/2016      Assessment and Plan  hiv disease = will check labs today  ckd 2/long term meds monitoring = will check cr to see if need to change his medication  Health maintenance = will refer to get colonoscopy

## 2016-11-04 LAB — RPR

## 2016-11-04 LAB — T-HELPER CELL (CD4) - (RCID CLINIC ONLY)
CD4 T CELL ABS: 470 /uL (ref 400–2700)
CD4 T CELL HELPER: 29 % — AB (ref 33–55)

## 2016-11-08 ENCOUNTER — Other Ambulatory Visit: Payer: Self-pay | Admitting: Internal Medicine

## 2016-11-08 LAB — HIV-1 RNA QUANT-NO REFLEX-BLD
HIV 1 RNA Quant: <20 copies/mL
HIV-1 RNA Quant, Log: <1.3 Log copies/mL

## 2016-12-07 ENCOUNTER — Other Ambulatory Visit: Payer: Self-pay | Admitting: Internal Medicine

## 2016-12-08 ENCOUNTER — Other Ambulatory Visit: Payer: Self-pay | Admitting: Internal Medicine

## 2017-01-15 ENCOUNTER — Other Ambulatory Visit: Payer: Self-pay | Admitting: Internal Medicine

## 2017-01-25 ENCOUNTER — Other Ambulatory Visit: Payer: Self-pay | Admitting: Internal Medicine

## 2017-02-07 ENCOUNTER — Ambulatory Visit: Payer: Medicare Other | Admitting: Internal Medicine

## 2017-02-18 ENCOUNTER — Other Ambulatory Visit: Payer: Self-pay | Admitting: Internal Medicine

## 2017-03-01 ENCOUNTER — Other Ambulatory Visit: Payer: Self-pay | Admitting: Internal Medicine

## 2017-03-15 ENCOUNTER — Telehealth: Payer: Self-pay

## 2017-03-15 NOTE — Telephone Encounter (Signed)
Called patient to try to schedule AWV in the office. No answer-left voicemail to call back.    

## 2017-03-17 ENCOUNTER — Telehealth: Payer: Self-pay

## 2017-03-17 NOTE — Telephone Encounter (Signed)
Second attempt to make AWV-S. No answer-left voicemail to call back.

## 2017-04-06 ENCOUNTER — Other Ambulatory Visit: Payer: Self-pay | Admitting: Internal Medicine

## 2017-04-24 ENCOUNTER — Ambulatory Visit: Payer: Medicare Other

## 2017-04-26 ENCOUNTER — Ambulatory Visit (INDEPENDENT_AMBULATORY_CARE_PROVIDER_SITE_OTHER): Payer: Medicare Other

## 2017-04-26 VITALS — BP 122/84 | HR 60 | Temp 98.1°F | Ht 66.0 in | Wt 163.0 lb

## 2017-04-26 DIAGNOSIS — Z1211 Encounter for screening for malignant neoplasm of colon: Secondary | ICD-10-CM

## 2017-04-26 DIAGNOSIS — Z0000 Encounter for general adult medical examination without abnormal findings: Secondary | ICD-10-CM

## 2017-04-26 NOTE — Patient Instructions (Signed)
Isaac Miller , Thank you for taking time to come for your Medicare Wellness Visit. I appreciate your ongoing commitment to your health goals. Please review the following plan we discussed and let me know if I can assist you in the future.   Screening recommendations/referrals: Colonoscopy due, referral sent in for you Recommended yearly ophthalmology/optometry visit for glaucoma screening and checkup Recommended yearly dental visit for hygiene and checkup  Vaccinations: Influenza vaccine up to date, due 2019 fall season Pneumococcal vaccine up to date Tdap vaccine due, prescription sent to pharmacy Shingles vaccine due at age 53  Advanced directives: Advance directive discussed with you today. I have provided a copy for you to complete at home and have notarized. Once this is complete please bring a copy in to our office so we can scan it into your chart.   Conditions/risks identified: none  Next appointment: Tyron Russell, RN  Preventive Care 40-64 Years, Male Preventive care refers to lifestyle choices and visits with your health care provider that can promote health and wellness. What does preventive care include?  A yearly physical exam. This is also called an annual well check.  Dental exams once or twice a year.  Routine eye exams. Ask your health care provider how often you should have your eyes checked.  Personal lifestyle choices, including:  Daily care of your teeth and gums.  Regular physical activity.  Eating a healthy diet.  Avoiding tobacco and drug use.  Limiting alcohol use.  Practicing safe sex.  Taking low-dose aspirin every day starting at age 53. What happens during an annual well check? The services and screenings done by your health care provider during your annual well check will depend on your age, overall health, lifestyle risk factors, and family history of disease. Counseling  Your health care provider may ask you questions about  your:  Alcohol use.  Tobacco use.  Drug use.  Emotional well-being.  Home and relationship well-being.  Sexual activity.  Eating habits.  Work and work Astronomer. Screening  You may have the following tests or measurements:  Height, weight, and BMI.  Blood pressure.  Lipid and cholesterol levels. These may be checked every 5 years, or more frequently if you are over 10 years old.  Skin check.  Lung cancer screening. You may have this screening every year starting at age 15 if you have a 30-pack-year history of smoking and currently smoke or have quit within the past 15 years.  Fecal occult blood test (FOBT) of the stool. You may have this test every year starting at age 53.  Flexible sigmoidoscopy or colonoscopy. You may have a sigmoidoscopy every 5 years or a colonoscopy every 10 years starting at age 53.  Prostate cancer screening. Recommendations will vary depending on your family history and other risks.  Hepatitis C blood test.  Hepatitis B blood test.  Sexually transmitted disease (STD) testing.  Diabetes screening. This is done by checking your blood sugar (glucose) after you have not eaten for a while (fasting). You may have this done every 1-3 years. Discuss your test results, treatment options, and if necessary, the need for more tests with your health care provider. Vaccines  Your health care provider may recommend certain vaccines, such as:  Influenza vaccine. This is recommended every year.  Tetanus, diphtheria, and acellular pertussis (Tdap, Td) vaccine. You may need a Td booster every 10 years.  Zoster vaccine. You may need this after age 26.  Pneumococcal 13-valent conjugate (PCV13) vaccine. You  may need this if you have certain conditions and have not been vaccinated.  Pneumococcal polysaccharide (PPSV23) vaccine. You may need one or two doses if you smoke cigarettes or if you have certain conditions. Talk to your health care provider about  which screenings and vaccines you need and how often you need them. This information is not intended to replace advice given to you by your health care provider. Make sure you discuss any questions you have with your health care provider. Document Released: 04/17/2015 Document Revised: 12/09/2015 Document Reviewed: 01/20/2015 Elsevier Interactive Patient Education  2017 [place] Prevention in the Home Falls can cause injuries. They can happen to people of all ages. There are many things you can do to make your home safe and to help prevent falls. What can I do on the outside of my home?  Regularly fix the edges of walkways and driveways and fix any cracks.  Remove anything that might make you trip as you walk through a door, such as a raised step or threshold.  Trim any bushes or trees on the path to your home.  Use bright outdoor lighting.  Clear any walking paths of anything that might make someone trip, such as rocks or tools.  Regularly check to see if handrails are loose or broken. Make sure that both sides of any steps have handrails.  Any raised decks and porches should have guardrails on the edges.  Have any leaves, snow, or ice cleared regularly.  Use sand or salt on walking paths during winter.  Clean up any spills in your garage right away. This includes oil or grease spills. What can I do in the bathroom?  Use night lights.  Install grab bars by the toilet and in the tub and shower. Do not use towel bars as grab bars.  Use non-skid mats or decals in the tub or shower.  If you need to sit down in the shower, use a plastic, non-slip stool.  Keep the floor dry. Clean up any water that spills on the floor as soon as it happens.  Remove soap buildup in the tub or shower regularly.  Attach bath mats securely with double-sided non-slip rug tape.  Do not have throw rugs and other things on the floor that can make you trip. What can I do in the  bedroom?  Use night lights.  Make sure that you have a light by your bed that is easy to reach.  Do not use any sheets or blankets that are too big for your bed. They should not hang down onto the floor.  Have a firm chair that has side arms. You can use this for support while you get dressed.  Do not have throw rugs and other things on the floor that can make you trip. What can I do in the kitchen?  Clean up any spills right away.  Avoid walking on wet floors.  Keep items that you use a lot in easy-to-reach places.  If you need to reach something above you, use a strong step stool that has a grab bar.  Keep electrical cords out of the way.  Do not use floor polish or wax that makes floors slippery. If you must use wax, use non-skid floor wax.  Do not have throw rugs and other things on the floor that can make you trip. What can I do with my stairs?  Do not leave any items on the stairs.  Make sure that there  are handrails on both sides of the stairs and use them. Fix handrails that are broken or loose. Make sure that handrails are as long as the stairways.  Check any carpeting to make sure that it is firmly attached to the stairs. Fix any carpet that is loose or worn.  Avoid having throw rugs at the top or bottom of the stairs. If you do have throw rugs, attach them to the floor with carpet tape.  Make sure that you have a light switch at the top of the stairs and the bottom of the stairs. If you do not have them, ask someone to add them for you. What else can I do to help prevent falls?  Wear shoes that:  Do not have high heels.  Have rubber bottoms.  Are comfortable and fit you well.  Are closed at the toe. Do not wear sandals.  If you use a stepladder:  Make sure that it is fully opened. Do not climb a closed stepladder.  Make sure that both sides of the stepladder are locked into place.  Ask someone to hold it for you, if possible.  Clearly mark and make  sure that you can see:  Any grab bars or handrails.  First and last steps.  Where the edge of each step is.  Use tools that help you move around (mobility aids) if they are needed. These include:  Canes.  Walkers.  Scooters.  Crutches.  Turn on the lights when you go into a dark area. Replace any light bulbs as soon as they burn out.  Set up your furniture so you have a clear path. Avoid moving your furniture around.  If any of your floors are uneven, fix them.  If there are any pets around you, be aware of where they are.  Review your medicines with your doctor. Some medicines can make you feel dizzy. This can increase your chance of falling. Ask your doctor what other things that you can do to help prevent falls. This information is not intended to replace advice given to you by your health care provider. Make sure you discuss any questions you have with your health care provider. Document Released: 01/15/2009 Document Revised: 08/27/2015 Document Reviewed: 04/25/2014 Elsevier Interactive Patient Education  2017 [employer]
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
## 2017-04-27 ENCOUNTER — Telehealth: Payer: Self-pay | Admitting: *Deleted

## 2017-04-27 NOTE — Telephone Encounter (Signed)
Error

## 2017-04-27 NOTE — Telephone Encounter (Signed)
Isaac Miller, Patient's brother's fiance called and stated that she had brought patient in yesterday and given a medication list of 6 medications but they only have 3 and she wanted to go over the medication list.   She is not in patient's HIPPA list to discuss information with. She will call patient to have him give us a call to give us verbal authorization to discuss his information with her. Awaiting call.

## 2017-04-27 NOTE — Telephone Encounter (Signed)
Noted.  Yes, I have not seen him in a long time.

## 2017-04-27 NOTE — Telephone Encounter (Signed)
Isaac Miller, Patient called and gave Verbal Consent that it was ok to Discuss his information with [facility].   Called and spoke with Boyd [facility] and she stated that patient is only taking Atorvastatin, Genvoya, and Bupropion. She was wondering if he should be on any of the other medications and if so a Rx needed to be sent to the pharmacy.   I reviewed medication list and the last time Citalopram was filled was 11/06/2014 and the Nexium was prescribed by Dr. Judyann Munsonynthia Snider. Patient does have an upcoming appointment on 05/11/17 with Dr. Renato Gailseed.

## 2017-05-09 ENCOUNTER — Other Ambulatory Visit: Payer: Self-pay | Admitting: Internal Medicine

## 2017-05-11 ENCOUNTER — Encounter: Payer: Self-pay | Admitting: Internal Medicine

## 2017-05-11 ENCOUNTER — Ambulatory Visit (INDEPENDENT_AMBULATORY_CARE_PROVIDER_SITE_OTHER): Payer: Medicare Other | Admitting: Internal Medicine

## 2017-05-11 VITALS — BP 110/70 | HR 75 | Temp 98.2°F | Wt 159.0 lb

## 2017-05-11 DIAGNOSIS — Z1211 Encounter for screening for malignant neoplasm of colon: Secondary | ICD-10-CM

## 2017-05-11 DIAGNOSIS — B20 Human immunodeficiency virus [HIV] disease: Secondary | ICD-10-CM | POA: Diagnosis not present

## 2017-05-11 DIAGNOSIS — H6123 Impacted cerumen, bilateral: Secondary | ICD-10-CM

## 2017-05-11 DIAGNOSIS — R739 Hyperglycemia, unspecified: Secondary | ICD-10-CM

## 2017-05-11 DIAGNOSIS — E785 Hyperlipidemia, unspecified: Secondary | ICD-10-CM | POA: Diagnosis not present

## 2017-05-11 DIAGNOSIS — Z716 Tobacco abuse counseling: Secondary | ICD-10-CM | POA: Diagnosis not present

## 2017-05-11 DIAGNOSIS — F339 Major depressive disorder, recurrent, unspecified: Secondary | ICD-10-CM

## 2017-05-11 DIAGNOSIS — F17218 Nicotine dependence, cigarettes, with other nicotine-induced disorders: Secondary | ICD-10-CM

## 2017-05-11 DIAGNOSIS — K219 Gastro-esophageal reflux disease without esophagitis: Secondary | ICD-10-CM | POA: Diagnosis not present
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
## 2017-05-11 NOTE — Patient Instructions (Signed)

## 2017-08-07 ENCOUNTER — Other Ambulatory Visit: Payer: Self-pay | Admitting: Internal Medicine

## 2017-08-12 ENCOUNTER — Other Ambulatory Visit: Payer: Self-pay | Admitting: Internal Medicine

## 2017-08-18 ENCOUNTER — Other Ambulatory Visit: Payer: Self-pay | Admitting: Internal Medicine

## 2017-08-18 ENCOUNTER — Telehealth: Payer: Self-pay | Admitting: Pharmacist Clinician (PhC)/ Clinical Pharmacy Specialist

## 2017-08-18 MED ORDER — BICTEGRAVIR-EMTRICITAB-TENOFOV 50-200-25 MG PO TABS: 1.0000 | ORAL_TABLET | Freq: Every day | ORAL | 1 refills | Status: DC
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
## 2017-09-05 ENCOUNTER — Other Ambulatory Visit: Payer: Medicare Other

## 2017-09-05 DIAGNOSIS — R739 Hyperglycemia, unspecified: Secondary | ICD-10-CM

## 2017-09-05 DIAGNOSIS — E785 Hyperlipidemia, unspecified: Secondary | ICD-10-CM | POA: Diagnosis not present

## 2017-09-06 LAB — LIPID PANEL
Cholesterol: 136 mg/dL (ref <200)
HDL: 58 mg/dL (ref >40)
LDL Cholesterol (Calc): 64 mg/dL (calc)
Non-HDL Cholesterol (Calc): 78 mg/dL (calc) (ref <130)
Total CHOL/HDL Ratio: 2.3 (calc) (ref <5.0)
Triglycerides: 55 mg/dL (ref <150)

## 2017-09-06 LAB — HEMOGLOBIN A1C
Hgb A1c MFr Bld: 5.5 % of total Hgb (ref <5.7)
Mean Plasma Glucose: 111 (calc)
eAG (mmol/L): 6.2 (calc)

## 2017-09-07 ENCOUNTER — Ambulatory Visit: Payer: Medicare Other | Admitting: Internal Medicine

## 2017-09-19 ENCOUNTER — Encounter: Payer: Self-pay | Admitting: Internal Medicine

## 2017-09-19 ENCOUNTER — Ambulatory Visit (INDEPENDENT_AMBULATORY_CARE_PROVIDER_SITE_OTHER): Payer: Medicare Other | Admitting: Internal Medicine

## 2017-09-19 VITALS — BP 138/84 | HR 70 | Temp 98.1°F | Ht 66.0 in | Wt 158.0 lb

## 2017-09-19 DIAGNOSIS — F1290 Cannabis use, unspecified, uncomplicated: Secondary | ICD-10-CM | POA: Diagnosis not present

## 2017-09-19 DIAGNOSIS — B20 Human immunodeficiency virus [HIV] disease: Secondary | ICD-10-CM

## 2017-09-19 DIAGNOSIS — E785 Hyperlipidemia, unspecified: Secondary | ICD-10-CM

## 2017-09-19 DIAGNOSIS — F17218 Nicotine dependence, cigarettes, with other nicotine-induced disorders: Secondary | ICD-10-CM
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
## 2017-09-19 NOTE — Patient Instructions (Addendum)
Continue current medications as ordered  Follow up with Dr Drue SecondSnider as scheduled  Smoking cessation discussed and highly urged. STOP USING MARIJUANA/WEED  Follow up in 4 mos with Dr Renato Gailseed for hyperlipidemia, chronic tobacco abuse  or sooner if need be

## 2017-10-04 ENCOUNTER — Ambulatory Visit (INDEPENDENT_AMBULATORY_CARE_PROVIDER_SITE_OTHER): Payer: Medicare Other | Admitting: Internal Medicine

## 2017-10-04 ENCOUNTER — Encounter: Payer: Self-pay | Admitting: Internal Medicine

## 2017-10-04 VITALS — BP 143/89 | HR 65 | Temp 97.5°F | Wt 168.0 lb

## 2017-10-04 DIAGNOSIS — E7800 Pure hypercholesterolemia, unspecified: Secondary | ICD-10-CM | POA: Diagnosis not present

## 2017-10-04 DIAGNOSIS — Z0000 Encounter for general adult medical examination without abnormal findings: Secondary | ICD-10-CM

## 2017-10-04 DIAGNOSIS — B20 Human immunodeficiency virus [HIV] disease: Secondary | ICD-10-CM

## 2017-10-04 DIAGNOSIS — N183 Chronic kidney disease, stage 3 unspecified: Secondary | ICD-10-CM

## 2017-10-04 NOTE — Progress Notes (Signed)
RFV: follow up for hiv disease  Patient ID: Isaac Miller, male   DOB: Sep 30, 1965, 52 y.o.   MRN: [id number]  HPI 52yo M with hiv disease, CKD 3, Cd 4 count. Last seen 11 months ago. He remains to do well with taking biktarvy daily. No recent illnesses or hospitalizaitons since we last saw him  Outpatient Encounter Medications as of 10/04/2017  Medication Sig  . atorvastatin (LIPITOR) 40 MG tablet TAKE 1 TABLET BY MOUTH DAILY  . bictegravir-emtricitabine-tenofovir AF (BIKTARVY) 50-200-25 MG TABS tablet Take 1 tablet by mouth daily.  Marland Kitchen. buPROPion (WELLBUTRIN SR) 150 MG 12 hr tablet TAKE 1 TABLET BY MOUTH TWICE DAILY   No facility-administered encounter medications on file as of 10/04/2017.      Patient Active Problem List   Diagnosis Date Noted  . GERD (gastroesophageal reflux disease) 06/25/2014  . Hyperlipidemia with target LDL less than 100 07/23/2012  . Hyperglycemia 07/23/2012  . Medication monitoring encounter 10/08/2010  . FRACTURE NOS, CLOSED 10/24/2006  . History of traumatic head injury 10/24/2006  . Human immunodeficiency virus (HIV) disease (HCC) 09/18/2006     Health Maintenance Due  Topic Date Due  . TETANUS/TDAP  08/31/1983  . COLONOSCOPY  08/31/2014    Social History   Tobacco Use  . Smoking status: Current Some Day Smoker    Years: 5.00    Types: Cigarettes  . Smokeless tobacco: Never Used  . Tobacco comment: 1 cig here and there   Substance Use Topics  . Alcohol use: Yes    Alcohol/week: 0.0 oz    Comment: 1 glass of wine a month per pt.   . Drug use: Yes    Types: Marijuana    Comment: Still does it on occasion   Review of Systems Review of Systems  Constitutional: Negative for fever, chills, diaphoresis, activity change, appetite change, fatigue and unexpected weight change.  HENT: Negative for congestion, sore throat, rhinorrhea, sneezing, trouble swallowing and sinus pressure.  Eyes: Negative for photophobia and visual disturbance.    Respiratory: Negative for cough, chest tightness, shortness of breath, wheezing and stridor.  Cardiovascular: Negative for chest pain, palpitations and leg swelling.  Gastrointestinal: Negative for nausea, vomiting, abdominal pain, diarrhea, constipation, blood in stool, abdominal distention and anal bleeding.  Genitourinary: Negative for dysuria, hematuria, flank pain and difficulty urinating.  Musculoskeletal: Negative for myalgias, back pain, joint swelling, arthralgias and gait problem.  Skin: Negative for color change, pallor, rash and wound.  Neurological: Negative for dizziness, tremors, weakness and light-headedness.  Hematological: Negative for adenopathy. Does not bruise/bleed easily.  Psychiatric/Behavioral: Negative for behavioral problems, confusion, sleep disturbance, dysphoric mood, decreased concentration and agitation.    Physical Exam   BP (!) 143/89   Pulse 65   Temp (!) 97.5 F (36.4 C) (Oral)   Wt 168 lb (76.2 kg)   BMI 27.12 kg/m   Physical Exam  Constitutional: He is oriented to person, place, and time. He appears well-developed and well-nourished. No distress.  HENT:  Mouth/Throat: Oropharynx is clear and moist. No oropharyngeal exudate.  Cardiovascular: Normal rate, regular rhythm and normal heart sounds. Exam reveals no gallop and no friction rub.  No murmur heard.  Pulmonary/Chest: Effort normal and breath sounds normal. No respiratory distress. He has no wheezes.  Abdominal: Soft. Bowel sounds are normal. He exhibits no distension. There is no tenderness.  Lymphadenopathy:  He has no cervical adenopathy.  Neurological: He is alert and oriented to person, place, and time.  Skin: Skin is  warm and dry. No rash noted. No erythema.  Psychiatric: He has a normal mood and affect. His behavior is normal.    Lab Results  Component Value Date   CD4TCELL 29 (L) 11/03/2016   Lab Results  Component Value Date   CD4TABS 470 11/03/2016   CD4TABS 640 03/15/2016    CD4TABS 670 09/08/2014   Lab Results  Component Value Date   HIV1RNAQUANT <20 NOT DETECTED 11/03/2016   Lab Results  Component Value Date   HEPBSAB POS (A) 12/17/2013   Lab Results  Component Value Date   LABRPR NON REAC 11/03/2016    CBC Lab Results  Component Value Date   WBC 6.0 11/03/2016   RBC 4.89 11/03/2016   HGB 13.7 11/03/2016   HCT 42.3 11/03/2016   PLT 232 11/03/2016   MCV 86.5 11/03/2016   MCH 28.0 11/03/2016   MCHC 32.4 11/03/2016   RDW 15.1 (H) 11/03/2016   LYMPHSABS 1,440 11/03/2016   MONOABS 360 11/03/2016   EOSABS 60 11/03/2016    BMET Lab Results  Component Value Date   NA 139 11/03/2016   K 4.1 11/03/2016   CL 106 11/03/2016   CO2 21 11/03/2016   GLUCOSE 76 11/03/2016   BUN 18 11/03/2016   CREATININE 1.80 (H) 11/03/2016   CALCIUM 9.3 11/03/2016   GFRNONAA 42 (L) 11/03/2016   GFRAA 49 (L) 11/03/2016      Assessment and Plan  hiv disease= currently on biktary. Well controlled previously. Will check labs today for refils  ckd 3 = will check his cr function to see if it has changed . Will check ua for proteinuria  Long term monitoring = thus far cr is still within range to use biktarvy  hypercholestermia = will check lipid profile

## 2017-10-06 LAB — HEPATITIS C ANTIBODY
HEP C AB: NONREACTIVE
SIGNAL TO CUT-OFF: 0.02 (ref <1.00)

## 2017-10-06 LAB — LIPID PANEL
CHOL/HDL RATIO: 2.2 (calc) (ref <5.0)
Cholesterol: 146 mg/dL (ref <200)
HDL: 67 mg/dL (ref >40)
LDL Cholesterol (Calc): 65 mg/dL (calc)
NON-HDL CHOLESTEROL (CALC): 79 mg/dL (ref <130)
Triglycerides: 63 mg/dL (ref <150)

## 2017-10-06 LAB — COMPLETE METABOLIC PANEL WITH GFR
AG Ratio: 1.5 (calc) (ref 1.0–2.5)
ALBUMIN MSPROF: 4.7 g/dL (ref 3.6–5.1)
ALT: 30 U/L (ref 9–46)
AST: 24 U/L (ref 10–35)
Alkaline phosphatase (APISO): 111 U/L (ref 40–115)
BUN/Creatinine Ratio: 10 (calc) (ref 6–22)
BUN: 16 mg/dL (ref 7–25)
CO2: 15 mmol/L — ABNORMAL LOW (ref 20–32)
CREATININE: 1.56 mg/dL — AB (ref 0.70–1.33)
Calcium: 9.8 mg/dL (ref 8.6–10.3)
Chloride: 109 mmol/L (ref 98–110)
GFR, EST AFRICAN AMERICAN: 58 mL/min/{1.73_m2} — AB (ref >=60)
GFR, EST NON AFRICAN AMERICAN: 50 mL/min/{1.73_m2} — AB (ref >=60)
GLOBULIN: 3.2 g/dL (ref 1.9–3.7)
Glucose, Bld: 79 mg/dL (ref 65–99)
Potassium: 4.2 mmol/L (ref 3.5–5.3)
SODIUM: 142 mmol/L (ref 135–146)
TOTAL PROTEIN: 7.9 g/dL (ref 6.1–8.1)
Total Bilirubin: 0.3 mg/dL (ref 0.2–1.2)

## 2017-10-06 LAB — RPR: RPR Ser Ql: NONREACTIVE

## 2017-10-06 LAB — T-HELPER CELL (CD4) - (RCID CLINIC ONLY)
CD4 % Helper T Cell: 30 % — ABNORMAL LOW (ref 33–55)
CD4 T CELL ABS: 450 /uL (ref 400–2700)

## 2017-10-06 LAB — CBC WITH DIFFERENTIAL/PLATELET

## 2017-10-09 LAB — HIV-1 RNA QUANT-NO REFLEX-BLD
HIV 1 RNA Quant: <20 copies/mL
HIV-1 RNA QUANT, LOG: NOT DETECTED {Log_copies}/mL

## 2017-10-16 ENCOUNTER — Other Ambulatory Visit: Payer: Self-pay | Admitting: Internal Medicine

## 2017-11-06 ENCOUNTER — Encounter: Payer: Self-pay | Admitting: Internal Medicine

## 2017-11-16 ENCOUNTER — Other Ambulatory Visit: Payer: Self-pay | Admitting: Internal Medicine

## 2018-01-04 ENCOUNTER — Ambulatory Visit: Payer: Medicare Other | Admitting: Internal Medicine

## 2018-01-09 DIAGNOSIS — B351 Tinea unguium: Secondary | ICD-10-CM | POA: Diagnosis not present

## 2018-01-09 DIAGNOSIS — M79671 Pain in right foot: Secondary | ICD-10-CM | POA: Diagnosis not present

## 2018-01-09 DIAGNOSIS — M79672 Pain in left foot: Secondary | ICD-10-CM | POA: Diagnosis not present

## 2018-01-09 DIAGNOSIS — L84 Corns and callosities: Secondary | ICD-10-CM | POA: Diagnosis not present

## 2018-01-10 ENCOUNTER — Ambulatory Visit: Payer: Medicare Other | Admitting: Internal Medicine

## 2018-01-16 ENCOUNTER — Ambulatory Visit: Payer: Medicare Other | Admitting: Internal Medicine

## 2018-01-25 ENCOUNTER — Other Ambulatory Visit: Payer: Self-pay | Admitting: Internal Medicine

## 2018-02-12 ENCOUNTER — Ambulatory Visit (INDEPENDENT_AMBULATORY_CARE_PROVIDER_SITE_OTHER): Payer: Medicare Other | Admitting: Internal Medicine

## 2018-02-12 ENCOUNTER — Encounter: Payer: Self-pay | Admitting: Internal Medicine

## 2018-02-12 VITALS — BP 161/94 | HR 70 | Temp 97.6°F | Ht 64.0 in | Wt 181.0 lb

## 2018-02-12 DIAGNOSIS — Z23 Encounter for immunization: Secondary | ICD-10-CM | POA: Diagnosis not present

## 2018-02-12 DIAGNOSIS — I10 Essential (primary) hypertension: Secondary | ICD-10-CM

## 2018-02-12 DIAGNOSIS — B20 Human immunodeficiency virus [HIV] disease: Secondary | ICD-10-CM | POA: Diagnosis not present

## 2018-02-12 DIAGNOSIS — N183 Chronic kidney disease, stage 3 unspecified: Secondary | ICD-10-CM

## 2018-02-12 MED ORDER — HYDROCHLOROTHIAZIDE 25 MG PO TABS: 25.0000 mg | ORAL_TABLET | Freq: Every day | ORAL | 11 refills | Status: DC
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
## 2018-02-13 LAB — T-HELPER CELL (CD4) - (RCID CLINIC ONLY)
CD4 T CELL HELPER: 32 % — AB (ref 33–55)
CD4 T Cell Abs: 430 /uL (ref 400–2700)

## 2018-02-14 LAB — COMPLETE METABOLIC PANEL WITH GFR
AG RATIO: 1.6 (calc) (ref 1.0–2.5)
ALT: 35 U/L (ref 9–46)
AST: 24 U/L (ref 10–35)
Albumin: 4.2 g/dL (ref 3.6–5.1)
Alkaline phosphatase (APISO): 94 U/L (ref 40–115)
BILIRUBIN TOTAL: 0.4 mg/dL (ref 0.2–1.2)
BUN/Creatinine Ratio: 14 (calc) (ref 6–22)
BUN: 23 mg/dL (ref 7–25)
CHLORIDE: 111 mmol/L — AB (ref 98–110)
CO2: 25 mmol/L (ref 20–32)
Calcium: 8.9 mg/dL (ref 8.6–10.3)
Creat: 1.64 mg/dL — ABNORMAL HIGH (ref 0.70–1.33)
GFR, Est African American: 55 mL/min/{1.73_m2} — ABNORMAL LOW (ref >=60)
GFR, Est Non African American: 47 mL/min/{1.73_m2} — ABNORMAL LOW (ref >=60)
Globulin: 2.6 g/dL (calc) (ref 1.9–3.7)
Glucose, Bld: 87 mg/dL (ref 65–99)
Potassium: 4 mmol/L (ref 3.5–5.3)
Sodium: 144 mmol/L (ref 135–146)
Total Protein: 6.8 g/dL (ref 6.1–8.1)

## 2018-02-14 LAB — CBC WITH DIFFERENTIAL/PLATELET
Basophils Absolute: 40 cells/uL (ref 0–200)
Basophils Relative: 0.7 %
EOS ABS: 91 {cells}/uL (ref 15–500)
Eosinophils Relative: 1.6 %
HCT: 42.6 % (ref 38.5–50.0)
Hemoglobin: 14 g/dL (ref 13.2–17.1)
Lymphs Abs: 1471 cells/uL (ref 850–3900)
MCH: 28.1 pg (ref 27.0–33.0)
MCHC: 32.9 g/dL (ref 32.0–36.0)
MCV: 85.5 fL (ref 80.0–100.0)
MONOS PCT: 7.3 %
MPV: 11.4 fL (ref 7.5–12.5)
Neutro Abs: 3682 cells/uL (ref 1500–7800)
Neutrophils Relative %: 64.6 %
Platelets: 239 10*3/uL (ref 140–400)
RBC: 4.98 10*6/uL (ref 4.20–5.80)
RDW: 13.6 % (ref 11.0–15.0)
TOTAL LYMPHOCYTE: 25.8 %
WBC mixed population: 416 cells/uL (ref 200–950)
WBC: 5.7 10*3/uL (ref 3.8–10.8)

## 2018-02-14 LAB — HIV-1 RNA QUANT-NO REFLEX-BLD
HIV 1 RNA QUANT: 60 {copies}/mL — AB
HIV-1 RNA Quant, Log: 1.78 Log copies/mL — ABNORMAL HIGH

## 2018-02-16 ENCOUNTER — Other Ambulatory Visit: Payer: Self-pay | Admitting: Internal Medicine

## 2018-02-16 DIAGNOSIS — I10 Essential (primary) hypertension: Secondary | ICD-10-CM

## 2018-03-22 ENCOUNTER — Encounter: Payer: Self-pay | Admitting: Internal Medicine

## 2018-04-27 ENCOUNTER — Ambulatory Visit: Payer: Self-pay

## 2018-04-27 ENCOUNTER — Encounter: Payer: Medicare Other | Admitting: Family

## 2018-04-27 ENCOUNTER — Other Ambulatory Visit: Payer: Self-pay | Admitting: Internal Medicine

## 2018-04-30 ENCOUNTER — Encounter: Payer: Self-pay | Admitting: Family

## 2018-04-30 ENCOUNTER — Ambulatory Visit (INDEPENDENT_AMBULATORY_CARE_PROVIDER_SITE_OTHER): Payer: Medicare Other | Admitting: Family

## 2018-04-30 VITALS — BP 110/80 | HR 75 | Temp 97.4°F | Resp 18 | Ht 64.0 in | Wt 181.8 lb

## 2018-04-30 DIAGNOSIS — H547 Unspecified visual loss: Secondary | ICD-10-CM

## 2018-04-30 DIAGNOSIS — Z23 Encounter for immunization: Secondary | ICD-10-CM | POA: Diagnosis not present

## 2018-04-30 DIAGNOSIS — Z0000 Encounter for general adult medical examination without abnormal findings: Secondary | ICD-10-CM | POA: Diagnosis not present

## 2018-04-30 MED ORDER — TETANUS-DIPHTH-ACELL PERTUSSIS 5-2.5-18.5 LF-MCG/0.5 IM SUSP: 0.5000 mL | Freq: Once | INTRAMUSCULAR | 0 refills | Status: AC
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
## 2018-04-30 NOTE — Patient Instructions (Signed)
Mr. Isaac Miller , Thank you for taking time to come for your Medicare Wellness Visit. I appreciate your ongoing commitment to your health goals. Please review the following plan we discussed and let me know if I can assist you in the future.   Screening recommendations/referrals: Colonoscopy: up to date due 08/30/2024 Recommended yearly ophthalmology/optometry visit for glaucoma screening and checkup Recommended yearly dental visit for hygiene and checkup  Vaccinations: Influenza vaccine: Up to dated  Pneumococcal vaccine : Up to date  Tdap vaccine: Ordered this visit Shingles vaccine: Declined     Advanced directives: None   Conditions/risks identified: Hypertension,Hyperlipidemia,Obesity BMI> 30 ,smoker.Recommended smoking cessation,dietary and lifestyle modification discussed.   Next appointment: 1 year   Preventive Care 40-64 Years, Male Preventive care refers to lifestyle choices and visits with your health care provider that can promote health and wellness. What does preventive care include?  A yearly physical exam. This is also called an annual well check.  Dental exams once or twice a year.  Routine eye exams. Ask your health care provider how often you should have your eyes checked.  Personal lifestyle choices, including:  Daily care of your teeth and gums.  Regular physical activity.  Eating a healthy diet.  Avoiding tobacco and drug use.  Limiting alcohol use.  Practicing safe sex.  Taking low-dose aspirin every day starting at age 40. What happens during an annual well check? The services and screenings done by your health care provider during your annual well check will depend on your age, overall health, lifestyle risk factors, and family history of disease. Counseling  Your health care provider may ask you questions about your:  Alcohol use.  Tobacco use.  Drug use.  Emotional well-being.  Home and relationship well-being.  Sexual  activity.  Eating habits.  Work and work Astronomer. Screening  You may have the following tests or measurements:  Height, weight, and BMI.  Blood pressure.  Lipid and cholesterol levels. These may be checked every 5 years, or more frequently if you are over 7 years old.  Skin check.  Lung cancer screening. You may have this screening every year starting at age 14 if you have a 30-pack-year history of smoking and currently smoke or have quit within the past 15 years.  Fecal occult blood test (FOBT) of the stool. You may have this test every year starting at age 50.  Flexible sigmoidoscopy or colonoscopy. You may have a sigmoidoscopy every 5 years or a colonoscopy every 10 years starting at age 11.  Prostate cancer screening. Recommendations will vary depending on your family history and other risks.  Hepatitis C blood test.  Hepatitis B blood test.  Sexually transmitted disease (STD) testing.  Diabetes screening. This is done by checking your blood sugar (glucose) after you have not eaten for a while (fasting). You may have this done every 1-3 years. Discuss your test results, treatment options, and if necessary, the need for more tests with your health care provider. Vaccines  Your health care provider may recommend certain vaccines, such as:  Influenza vaccine. This is recommended every year.  Tetanus, diphtheria, and acellular pertussis (Tdap, Td) vaccine. You may need a Td booster every 10 years.  Zoster vaccine. You may need this after age 54.  Pneumococcal 13-valent conjugate (PCV13) vaccine. You may need this if you have certain conditions and have not been vaccinated.  Pneumococcal polysaccharide (PPSV23) vaccine. You may need one or two doses if you smoke cigarettes or if you have  certain conditions. Talk to your health care provider about which screenings and vaccines you need and how often you need them. This information is not intended to replace advice  given to you by your health care provider. Make sure you discuss any questions you have with your health care provider. Document Released: 04/17/2015 Document Revised: 12/09/2015 Document Reviewed: 01/20/2015 Elsevier Interactive Patient Education  2017 [place] Prevention in the Home Falls can cause injuries. They can happen to people of all ages. There are many things you can do to make your home safe and to help prevent falls. What can I do on the outside of my home?  Regularly fix the edges of walkways and driveways and fix any cracks.  Remove anything that might make you trip as you walk through a door, such as a raised step or threshold.  Trim any bushes or trees on the path to your home.  Use bright outdoor lighting.  Clear any walking paths of anything that might make someone trip, such as rocks or tools.  Regularly check to see if handrails are loose or broken. Make sure that both sides of any steps have handrails.  Any raised decks and porches should have guardrails on the edges.  Have any leaves, snow, or ice cleared regularly.  Use sand or salt on walking paths during winter.  Clean up any spills in your garage right away. This includes oil or grease spills. What can I do in the bathroom?  Use night lights.  Install grab bars by the toilet and in the tub and shower. Do not use towel bars as grab bars.  Use non-skid mats or decals in the tub or shower.  If you need to sit down in the shower, use a plastic, non-slip stool.  Keep the floor dry. Clean up any water that spills on the floor as soon as it happens.  Remove soap buildup in the tub or shower regularly.  Attach bath mats securely with double-sided non-slip rug tape.  Do not have throw rugs and other things on the floor that can make you trip. What can I do in the bedroom?  Use night lights.  Make sure that you have a light by your bed that is easy to reach.  Do not use any sheets or  blankets that are too big for your bed. They should not hang down onto the floor.  Have a firm chair that has side arms. You can use this for support while you get dressed.  Do not have throw rugs and other things on the floor that can make you trip. What can I do in the kitchen?  Clean up any spills right away.  Avoid walking on wet floors.  Keep items that you use a lot in easy-to-reach places.  If you need to reach something above you, use a strong step stool that has a grab bar.  Keep electrical cords out of the way.  Do not use floor polish or wax that makes floors slippery. If you must use wax, use non-skid floor wax.  Do not have throw rugs and other things on the floor that can make you trip. What can I do with my stairs?  Do not leave any items on the stairs.  Make sure that there are handrails on both sides of the stairs and use them. Fix handrails that are broken or loose. Make sure that handrails are as long as the stairways.  Check any carpeting to  make sure that it is firmly attached to the stairs. Fix any carpet that is loose or worn.  Avoid having throw rugs at the top or bottom of the stairs. If you do have throw rugs, attach them to the floor with carpet tape.  Make sure that you have a light switch at the top of the stairs and the bottom of the stairs. If you do not have them, ask someone to add them for you. What else can I do to help prevent falls?  Wear shoes that:  Do not have high heels.  Have rubber bottoms.  Are comfortable and fit you well.  Are closed at the toe. Do not wear sandals.  If you use a stepladder:  Make sure that it is fully opened. Do not climb a closed stepladder.  Make sure that both sides of the stepladder are locked into place.  Ask someone to hold it for you, if possible.  Clearly mark and make sure that you can see:  Any grab bars or handrails.  First and last steps.  Where the edge of each step is.  Use tools  that help you move around (mobility aids) if they are needed. These include:  Canes.  Walkers.  Scooters.  Crutches.  Turn on the lights when you go into a dark area. Replace any light bulbs as soon as they burn out.  Set up your furniture so you have a clear path. Avoid moving your furniture around.  If any of your floors are uneven, fix them.  If there are any pets around you, be aware of where they are.  Review your medicines with your doctor. Some medicines can make you feel dizzy. This can increase your chance of falling. Ask your doctor what other things that you can do to help prevent falls. This information is not intended to replace advice given to you by your health care provider. Make sure you discuss any questions you have with your health care provider. Document Released: 01/15/2009 Document Revised: 08/27/2015 Document Reviewed: 04/25/2014 Elsevier Interactive Patient Education  2017 [employer].

## 2018-05-15 ENCOUNTER — Other Ambulatory Visit: Payer: Medicare Other

## 2018-05-15 ENCOUNTER — Other Ambulatory Visit: Payer: Self-pay | Admitting: Behavioral Health

## 2018-05-15 DIAGNOSIS — I10 Essential (primary) hypertension: Secondary | ICD-10-CM

## 2018-05-15 DIAGNOSIS — B20 Human immunodeficiency virus [HIV] disease: Secondary | ICD-10-CM

## 2018-05-16 LAB — T-HELPER CELL (CD4) - (RCID CLINIC ONLY)
CD4 % Helper T Cell: 31 % — ABNORMAL LOW (ref 33–55)
CD4 T Cell Abs: 570 /uL (ref 400–2700)

## 2018-05-17 LAB — BASIC METABOLIC PANEL
BUN/Creatinine Ratio: 15 (calc) (ref 6–22)
BUN: 24 mg/dL (ref 7–25)
CO2: 28 mmol/L (ref 20–32)
Calcium: 9.4 mg/dL (ref 8.6–10.3)
Chloride: 102 mmol/L (ref 98–110)
Creat: 1.57 mg/dL — ABNORMAL HIGH (ref 0.70–1.33)
Glucose, Bld: 84 mg/dL (ref 65–99)
Potassium: 4.1 mmol/L (ref 3.5–5.3)
Sodium: 140 mmol/L (ref 135–146)

## 2018-05-17 LAB — CBC WITH DIFFERENTIAL/PLATELET
Absolute Monocytes: 372 cells/uL (ref 200–950)
Basophils Absolute: 68 cells/uL (ref 0–200)
Basophils Relative: 1.1 %
EOS PCT: 1.6 %
Eosinophils Absolute: 99 cells/uL (ref 15–500)
HCT: 46.1 % (ref 38.5–50.0)
Hemoglobin: 15.3 g/dL (ref 13.2–17.1)
Lymphs Abs: 1742 cells/uL (ref 850–3900)
MCH: 28.3 pg (ref 27.0–33.0)
MCHC: 33.2 g/dL (ref 32.0–36.0)
MCV: 85.2 fL (ref 80.0–100.0)
MPV: 11.5 fL (ref 7.5–12.5)
Monocytes Relative: 6 %
Neutro Abs: 3918 cells/uL (ref 1500–7800)
Neutrophils Relative %: 63.2 %
Platelets: 225 10*3/uL (ref 140–400)
RBC: 5.41 10*6/uL (ref 4.20–5.80)
RDW: 14 % (ref 11.0–15.0)
Total Lymphocyte: 28.1 %
WBC: 6.2 10*3/uL (ref 3.8–10.8)

## 2018-05-17 LAB — HIV-1 RNA QUANT-NO REFLEX-BLD
HIV 1 RNA Quant: <20 copies/mL — AB
HIV-1 RNA Quant, Log: <1.3 Log copies/mL — AB

## 2018-05-24 ENCOUNTER — Other Ambulatory Visit: Payer: Self-pay | Admitting: Internal Medicine

## 2018-06-04 ENCOUNTER — Encounter: Payer: Medicare Other | Admitting: Internal Medicine

## 2018-07-01 ENCOUNTER — Other Ambulatory Visit: Payer: Self-pay | Admitting: Internal Medicine

## 2018-07-05 ENCOUNTER — Encounter: Payer: Medicare Other | Admitting: Family

## 2018-07-09 ENCOUNTER — Other Ambulatory Visit: Payer: Self-pay | Admitting: Internal Medicine

## 2018-07-29 ENCOUNTER — Encounter: Payer: Self-pay | Admitting: Internal Medicine

## 2018-08-05 ENCOUNTER — Other Ambulatory Visit: Payer: Self-pay | Admitting: Internal Medicine

## 2018-08-11 ENCOUNTER — Other Ambulatory Visit: Payer: Self-pay | Admitting: Internal Medicine

## 2018-09-07 ENCOUNTER — Other Ambulatory Visit: Payer: Self-pay | Admitting: Internal Medicine

## 2018-09-24 ENCOUNTER — Other Ambulatory Visit: Payer: Self-pay | Admitting: Internal Medicine

## 2018-09-27 ENCOUNTER — Telehealth: Payer: Self-pay

## 2018-09-27 MED ORDER — BUPROPION HCL ER (SR) 150 MG PO TB12: 150.0000 mg | ORAL_TABLET | Freq: Two times a day (BID) | ORAL | 0 refills | Status: DC

## 2018-09-27 NOTE — Telephone Encounter (Signed)
Isaac Miller called for Isaac Miller to request a refill on his medication ( Wellbutrin )

## 2018-10-22 ENCOUNTER — Other Ambulatory Visit: Payer: Self-pay | Admitting: Internal Medicine

## 2018-10-22 NOTE — Telephone Encounter (Signed)
Gave pt #30 pt needs an appt, last seen 09/19/17 by Dr. Eulas Post

## 2018-11-15 ENCOUNTER — Other Ambulatory Visit: Payer: Self-pay | Admitting: Internal Medicine

## 2018-11-15 NOTE — Telephone Encounter (Signed)
PATIENT NEEDS TO BE SEEN FOR FURTHER REFILLS  

## 2018-12-13 DIAGNOSIS — H1132 Conjunctival hemorrhage, left eye: Secondary | ICD-10-CM | POA: Diagnosis not present

## 2018-12-13 DIAGNOSIS — S0003XA Contusion of scalp, initial encounter: Secondary | ICD-10-CM | POA: Diagnosis not present

## 2018-12-13 DIAGNOSIS — S63502A Unspecified sprain of left wrist, initial encounter: Secondary | ICD-10-CM | POA: Diagnosis not present

## 2019-02-10 ENCOUNTER — Other Ambulatory Visit: Payer: Self-pay | Admitting: Internal Medicine

## 2019-02-11 ENCOUNTER — Telehealth: Payer: Self-pay

## 2019-02-11 NOTE — Telephone Encounter (Signed)
Attempted to call patient regarding overdue follow up appointment. Unable to leave voicemail. Will send in 1 refill with no further refills until seen by provider.  Isaac Miller

## 2019-02-14 ENCOUNTER — Other Ambulatory Visit: Payer: Self-pay | Admitting: Internal Medicine

## 2019-03-15 ENCOUNTER — Other Ambulatory Visit: Payer: Self-pay | Admitting: Internal Medicine

## 2019-07-30 DIAGNOSIS — N289 Disorder of kidney and ureter, unspecified: Secondary | ICD-10-CM | POA: Diagnosis not present

## 2019-07-30 DIAGNOSIS — R404 Transient alteration of awareness: Secondary | ICD-10-CM | POA: Diagnosis not present

## 2019-07-30 DIAGNOSIS — Z87820 Personal history of traumatic brain injury: Secondary | ICD-10-CM | POA: Diagnosis not present

## 2019-07-30 DIAGNOSIS — R2242 Localized swelling, mass and lump, left lower limb: Secondary | ICD-10-CM | POA: Diagnosis not present

## 2019-07-30 DIAGNOSIS — E872 Acidosis: Secondary | ICD-10-CM | POA: Diagnosis not present

## 2019-07-30 DIAGNOSIS — E161 Other hypoglycemia: Secondary | ICD-10-CM | POA: Diagnosis not present

## 2019-07-30 DIAGNOSIS — G9341 Metabolic encephalopathy: Secondary | ICD-10-CM | POA: Diagnosis not present

## 2019-07-30 DIAGNOSIS — Z20822 Contact with and (suspected) exposure to covid-19: Secondary | ICD-10-CM | POA: Diagnosis not present

## 2019-07-30 DIAGNOSIS — I129 Hypertensive chronic kidney disease with stage 1 through stage 4 chronic kidney disease, or unspecified chronic kidney disease: Secondary | ICD-10-CM | POA: Diagnosis not present

## 2019-07-30 DIAGNOSIS — S069XAA Unspecified intracranial injury with loss of consciousness status unknown, initial encounter: Secondary | ICD-10-CM | POA: Insufficient documentation

## 2019-07-30 DIAGNOSIS — R4182 Altered mental status, unspecified: Secondary | ICD-10-CM | POA: Diagnosis not present

## 2019-07-30 DIAGNOSIS — T6709XA Other heatstroke and sunstroke, initial encounter: Secondary | ICD-10-CM | POA: Diagnosis not present

## 2019-07-30 DIAGNOSIS — R410 Disorientation, unspecified: Secondary | ICD-10-CM | POA: Diagnosis not present

## 2019-07-30 DIAGNOSIS — G3184 Mild cognitive impairment, so stated: Secondary | ICD-10-CM | POA: Insufficient documentation

## 2019-07-30 DIAGNOSIS — A419 Sepsis, unspecified organism: Secondary | ICD-10-CM | POA: Diagnosis not present

## 2019-07-30 DIAGNOSIS — R000 Tachycardia, unspecified: Secondary | ICD-10-CM | POA: Diagnosis not present

## 2019-07-30 DIAGNOSIS — N179 Acute kidney failure, unspecified: Secondary | ICD-10-CM | POA: Diagnosis not present

## 2019-07-30 DIAGNOSIS — R6510 Systemic inflammatory response syndrome (SIRS) of non-infectious origin without acute organ dysfunction: Secondary | ICD-10-CM | POA: Diagnosis not present

## 2019-07-30 DIAGNOSIS — Z21 Asymptomatic human immunodeficiency virus [HIV] infection status: Secondary | ICD-10-CM | POA: Diagnosis present

## 2019-07-30 DIAGNOSIS — E162 Hypoglycemia, unspecified: Secondary | ICD-10-CM | POA: Diagnosis not present

## 2019-07-30 DIAGNOSIS — B20 Human immunodeficiency virus [HIV] disease: Secondary | ICD-10-CM | POA: Diagnosis not present

## 2019-07-30 DIAGNOSIS — I517 Cardiomegaly: Secondary | ICD-10-CM | POA: Diagnosis not present

## 2019-07-30 DIAGNOSIS — I10 Essential (primary) hypertension: Secondary | ICD-10-CM | POA: Diagnosis not present

## 2019-07-30 DIAGNOSIS — R0902 Hypoxemia: Secondary | ICD-10-CM | POA: Diagnosis not present

## 2019-07-30 DIAGNOSIS — R21 Rash and other nonspecific skin eruption: Secondary | ICD-10-CM | POA: Diagnosis not present

## 2019-07-30 DIAGNOSIS — G9389 Other specified disorders of brain: Secondary | ICD-10-CM | POA: Insufficient documentation

## 2019-07-30 DIAGNOSIS — R9431 Abnormal electrocardiogram [ECG] [EKG]: Secondary | ICD-10-CM | POA: Diagnosis not present

## 2019-07-30 DIAGNOSIS — E785 Hyperlipidemia, unspecified: Secondary | ICD-10-CM | POA: Diagnosis present

## 2019-07-30 DIAGNOSIS — N189 Chronic kidney disease, unspecified: Secondary | ICD-10-CM | POA: Diagnosis not present

## 2019-07-30 DIAGNOSIS — N183 Chronic kidney disease, stage 3 unspecified: Secondary | ICD-10-CM | POA: Diagnosis not present

## 2019-07-30 DIAGNOSIS — R2241 Localized swelling, mass and lump, right lower limb: Secondary | ICD-10-CM | POA: Diagnosis not present

## 2019-07-30 DIAGNOSIS — R7989 Other specified abnormal findings of blood chemistry: Secondary | ICD-10-CM | POA: Diagnosis not present

## 2019-08-01 ENCOUNTER — Telehealth: Payer: Self-pay | Admitting: *Deleted

## 2019-08-01 MED ORDER — SODIUM CHLORIDE FLUSH 0.9 % IV SOLN
5.00 | INTRAVENOUS | Status: DC
Start: ? — End: 2019-08-01

## 2019-08-01 MED ORDER — ALUM & MAG HYDROXIDE-SIMETH 200-200-20 MG/5ML PO SUSP
30.00 | ORAL | Status: DC
Start: ? — End: 2019-08-01

## 2019-08-01 MED ORDER — ENOXAPARIN SODIUM 40 MG/0.4ML ~~LOC~~ SOLN
40.00 | SUBCUTANEOUS | Status: DC
Start: 2019-08-03 — End: 2019-08-01

## 2019-08-01 MED ORDER — ACETAMINOPHEN 325 MG PO TABS
650.00 | ORAL_TABLET | ORAL | Status: DC
Start: ? — End: 2019-08-01

## 2019-08-01 MED ORDER — SODIUM CHLORIDE FLUSH 0.9 % IV SOLN
5.00 | INTRAVENOUS | Status: DC
Start: 2019-08-02 — End: 2019-08-01

## 2019-08-01 MED ORDER — CLONIDINE HCL 0.1 MG PO TABS
0.10 | ORAL_TABLET | ORAL | Status: DC
Start: ? — End: 2019-08-01

## 2019-08-01 MED ORDER — ATORVASTATIN CALCIUM 40 MG PO TABS
40.00 | ORAL_TABLET | ORAL | Status: DC
Start: 2019-08-02 — End: 2019-08-01

## 2019-08-01 MED ORDER — BUPROPION HCL ER (XL) 300 MG PO TB24
300.00 | ORAL_TABLET | ORAL | Status: DC
Start: 2019-08-03 — End: 2019-08-01

## 2019-08-01 MED ORDER — ONDANSETRON 4 MG PO TBDP
4.00 | ORAL_TABLET | ORAL | Status: DC
Start: ? — End: 2019-08-01

## 2019-08-01 MED ORDER — POTASSIUM CHLORIDE IN NACL 20-0.9 MEQ/L-% IV SOLN
INTRAVENOUS | Status: DC
Start: ? — End: 2019-08-01

## 2019-08-01 MED ORDER — BICTEGRAVIR-EMTRICITAB-TENOFOV 50-200-25 MG PO TABS
1.00 | ORAL_TABLET | ORAL | Status: DC
Start: 2019-08-03 — End: 2019-08-01

## 2019-08-01 MED ORDER — ONDANSETRON HCL 4 MG/2ML IJ SOLN
4.00 | INTRAMUSCULAR | Status: DC
Start: ? — End: 2019-08-01

## 2019-08-01 MED ORDER — DSS 100 MG PO CAPS
100.00 | ORAL_CAPSULE | ORAL | Status: DC
Start: ? — End: 2019-08-01

## 2019-08-01 MED ORDER — ZOLPIDEM TARTRATE 5 MG PO TABS
5.00 | ORAL_TABLET | ORAL | Status: DC
Start: ? — End: 2019-08-01

## 2019-08-01 NOTE — Telephone Encounter (Signed)
Patient admitted at [facility]. Isaac Miller relayed admission, will get his ADAP renewed. He sees that the hospitalist restarted Biktarvy. Isaac Miller will update [place], try to coordinate care for patient after discharge ([facility], [facility], appointments). Andree Coss, RN

## 2019-08-02 ENCOUNTER — Telehealth: Payer: Self-pay | Admitting: *Deleted

## 2019-08-02 ENCOUNTER — Other Ambulatory Visit: Payer: Self-pay | Admitting: Internal Medicine

## 2019-08-02 MED ORDER — BIKTARVY 50-200-25 MG PO TABS: ORAL_TABLET | ORAL | 0 refills | Status: DC

## 2019-08-02 MED ORDER — BUPROPION HCL ER (SR) 150 MG PO TB12: ORAL_TABLET | ORAL | 0 refills | Status: DC

## 2019-08-02 MED ORDER — ATORVASTATIN CALCIUM 40 MG PO TABS: 40.0000 mg | ORAL_TABLET | Freq: Every day | ORAL | 0 refills | Status: DC

## 2019-08-02 MED ORDER — HYDROCHLOROTHIAZIDE 25 MG PO TABS: 25.0000 mg | ORAL_TABLET | Freq: Every day | ORAL | 0 refills | Status: DC

## 2019-08-02 NOTE — Telephone Encounter (Signed)
Transition Care Management Follow-up Telephone Call  Date of discharge and from where: 08/02/2019 [facility]  How have you been since you were released from the hospital? better  Any questions or concerns? No   Items Reviewed:  Did the pt receive and understand the discharge instructions provided? Yes   Medications obtained and verified? Yes  Didn't have bottles on hand  Any new allergies since your discharge? No   Dietary orders reviewed? Yes  Do you have support at home? Yes   Other (ie: DME, Home Health, etc) Home Health  Functional Questionnaire: (I = Independent and D = Dependent) ADL's: I  Bathing/Dressing- I   Meal Prep- D  Eating- I  Maintaining continence- I  Transferring/Ambulation- I  Managing Meds- I with assistance   Follow up appointments reviewed:    PCP Hospital f/u appt confirmed? Yes  Scheduled to see Dr. Renato Gails on 08/15/19 @ 8:00.  Specialist Hospital f/u appt confirmed? No    Are transportation arrangements needed? No   If their condition worsens, is the pt aware to call  their PCP or go to the ED? Yes  Was the patient provided with contact information for the PCP's office or ED? Yes  Was the pt encouraged to call back with questions or concerns? Yes

## 2019-08-06 ENCOUNTER — Telehealth: Payer: Self-pay | Admitting: *Deleted

## 2019-08-06 NOTE — Telephone Encounter (Signed)
Patient will follow up at [facility] clinic after hospital discharge. Rosanne Ashing called to let [facility] know.  Will notify [facility] as well. Andree Coss, RN

## 2019-08-15 ENCOUNTER — Other Ambulatory Visit: Payer: Self-pay

## 2019-08-15 ENCOUNTER — Ambulatory Visit (INDEPENDENT_AMBULATORY_CARE_PROVIDER_SITE_OTHER): Payer: Medicare Other | Admitting: Internal Medicine

## 2019-08-15 ENCOUNTER — Encounter: Payer: Self-pay | Admitting: Internal Medicine

## 2019-08-15 VITALS — BP 122/82 | HR 79 | Temp 97.8°F | Ht 64.0 in | Wt 142.0 lb

## 2019-08-15 DIAGNOSIS — Z9183 Wandering in diseases classified elsewhere: Secondary | ICD-10-CM | POA: Diagnosis not present

## 2019-08-15 DIAGNOSIS — R410 Disorientation, unspecified: Secondary | ICD-10-CM | POA: Diagnosis not present

## 2019-08-15 DIAGNOSIS — F0391 Unspecified dementia with behavioral disturbance: Secondary | ICD-10-CM

## 2019-08-15 DIAGNOSIS — F17218 Nicotine dependence, cigarettes, with other nicotine-induced disorders: Secondary | ICD-10-CM

## 2019-08-15 DIAGNOSIS — W19XXXA Unspecified fall, initial encounter: Secondary | ICD-10-CM

## 2019-08-15 DIAGNOSIS — Z87828 Personal history of other (healed) physical injury and trauma: Secondary | ICD-10-CM

## 2019-08-15 DIAGNOSIS — R2681 Unsteadiness on feet: Secondary | ICD-10-CM | POA: Diagnosis not present

## 2019-08-15 DIAGNOSIS — E785 Hyperlipidemia, unspecified: Secondary | ICD-10-CM | POA: Diagnosis not present

## 2019-08-15 DIAGNOSIS — B20 Human immunodeficiency virus [HIV] disease: Secondary | ICD-10-CM | POA: Diagnosis not present

## 2019-08-15 DIAGNOSIS — F0281 Dementia in other diseases classified elsewhere with behavioral disturbance: Secondary | ICD-10-CM | POA: Diagnosis not present

## 2019-08-15 DIAGNOSIS — H547 Unspecified visual loss: Secondary | ICD-10-CM | POA: Diagnosis not present

## 2019-08-15 DIAGNOSIS — F02818 Dementia in other diseases classified elsewhere, unspecified severity, with other behavioral disturbance: Secondary | ICD-10-CM
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
## 2019-08-15 NOTE — Patient Instructions (Signed)
Alzheimer's safe return program Baby monitor  Higher height door lock Let me know if he's still wandering despite interventions above, we can complete an FL2 form for placement in a locked unit.

## 2019-08-16 LAB — CBC WITH DIFFERENTIAL/PLATELET
Absolute Monocytes: 455 cells/uL (ref 200–950)
Basophils Absolute: 28 cells/uL (ref 0–200)
Basophils Relative: 0.4 %
Eosinophils Absolute: 28 cells/uL (ref 15–500)
Eosinophils Relative: 0.4 %
HCT: 42.5 % (ref 38.5–50.0)
Hemoglobin: 13.8 g/dL (ref 13.2–17.1)
Lymphs Abs: 1477 cells/uL (ref 850–3900)
MCH: 26.3 pg — ABNORMAL LOW (ref 27.0–33.0)
MCHC: 32.5 g/dL (ref 32.0–36.0)
MCV: 81.1 fL (ref 80.0–100.0)
MPV: 12.2 fL (ref 7.5–12.5)
Monocytes Relative: 6.5 %
Neutro Abs: 5012 cells/uL (ref 1500–7800)
Neutrophils Relative %: 71.6 %
Platelets: 248 10*3/uL (ref 140–400)
RBC: 5.24 10*6/uL (ref 4.20–5.80)
RDW: 15.8 % — ABNORMAL HIGH (ref 11.0–15.0)
Total Lymphocyte: 21.1 %
WBC: 7 10*3/uL (ref 3.8–10.8)

## 2019-08-16 LAB — BASIC METABOLIC PANEL
BUN/Creatinine Ratio: 14 (calc) (ref 6–22)
BUN: 25 mg/dL (ref 7–25)
CO2: 18 mmol/L — ABNORMAL LOW (ref 20–32)
Calcium: 9.8 mg/dL (ref 8.6–10.3)
Chloride: 104 mmol/L (ref 98–110)
Creat: 1.78 mg/dL — ABNORMAL HIGH (ref 0.70–1.33)
Glucose, Bld: 79 mg/dL (ref 65–99)
Potassium: 4.3 mmol/L (ref 3.5–5.3)
Sodium: 137 mmol/L (ref 135–146)

## 2019-08-16 LAB — LIPID PANEL
Cholesterol: 156 mg/dL (ref <200)
HDL: 56 mg/dL (ref >=40)
LDL Cholesterol (Calc): 86 mg/dL (calc)
Non-HDL Cholesterol (Calc): 100 mg/dL (calc) (ref <130)
Total CHOL/HDL Ratio: 2.8 (calc) (ref <5.0)
Triglycerides: 55 mg/dL (ref <150)

## 2019-08-16 NOTE — Progress Notes (Signed)
Please notify Isaac Miller's brother: Bad cholesterol is at goal with the lipitor.  Continue it.   Kidney function has declined some from prior checks.  It's important that Isaac Miller be drinking 6-8 8oz glasses of water per day to stay hydrated. He is not anemic.

## 2019-08-20 ENCOUNTER — Other Ambulatory Visit: Payer: Self-pay | Admitting: Internal Medicine

## 2019-08-28 ENCOUNTER — Other Ambulatory Visit: Payer: Self-pay | Admitting: Internal Medicine

## 2019-08-28 NOTE — Telephone Encounter (Signed)
Will send rx request for Biktarvy to Dr.Reed to advise if ok to approve. Patient is under the care of infectious disease provider.

## 2019-09-01 DIAGNOSIS — G9389 Other specified disorders of brain: Secondary | ICD-10-CM | POA: Diagnosis not present

## 2019-09-01 DIAGNOSIS — Z21 Asymptomatic human immunodeficiency virus [HIV] infection status: Secondary | ICD-10-CM | POA: Diagnosis not present

## 2019-09-01 DIAGNOSIS — E878 Other disorders of electrolyte and fluid balance, not elsewhere classified: Secondary | ICD-10-CM | POA: Diagnosis not present

## 2019-09-01 DIAGNOSIS — G9341 Metabolic encephalopathy: Secondary | ICD-10-CM | POA: Diagnosis not present

## 2019-09-01 DIAGNOSIS — I6782 Cerebral ischemia: Secondary | ICD-10-CM | POA: Diagnosis not present

## 2019-09-01 DIAGNOSIS — Z20822 Contact with and (suspected) exposure to covid-19: Secondary | ICD-10-CM | POA: Diagnosis not present

## 2019-09-01 DIAGNOSIS — E860 Dehydration: Secondary | ICD-10-CM | POA: Diagnosis not present

## 2019-09-01 DIAGNOSIS — F0391 Unspecified dementia with behavioral disturbance: Secondary | ICD-10-CM | POA: Diagnosis not present

## 2019-09-01 DIAGNOSIS — E871 Hypo-osmolality and hyponatremia: Secondary | ICD-10-CM | POA: Diagnosis not present

## 2019-09-01 DIAGNOSIS — R4182 Altered mental status, unspecified: Secondary | ICD-10-CM | POA: Diagnosis not present

## 2019-09-10 ENCOUNTER — Other Ambulatory Visit: Payer: Self-pay

## 2019-09-10 DIAGNOSIS — B20 Human immunodeficiency virus [HIV] disease: Secondary | ICD-10-CM

## 2019-09-10 DIAGNOSIS — Z79899 Other long term (current) drug therapy: Secondary | ICD-10-CM

## 2019-09-10 DIAGNOSIS — Z113 Encounter for screening for infections with a predominantly sexual mode of transmission: Secondary | ICD-10-CM

## 2019-09-13 ENCOUNTER — Other Ambulatory Visit: Payer: Medicare Other

## 2019-09-27 ENCOUNTER — Other Ambulatory Visit: Payer: Self-pay | Admitting: Internal Medicine

## 2019-09-27 NOTE — Telephone Encounter (Signed)
rx sent to pharmacy by e-script  

## 2019-09-30 ENCOUNTER — Encounter: Payer: Medicare Other | Admitting: Internal Medicine

## 2019-10-03 ENCOUNTER — Other Ambulatory Visit: Payer: Medicare Other

## 2019-10-21 ENCOUNTER — Encounter: Payer: Medicare Other | Admitting: Internal Medicine

## 2019-10-29 ENCOUNTER — Ambulatory Visit: Payer: Medicare Other | Admitting: Internal Medicine

## 2019-10-31 ENCOUNTER — Other Ambulatory Visit: Payer: Self-pay

## 2019-10-31 ENCOUNTER — Encounter: Payer: Self-pay | Admitting: Internal Medicine

## 2019-10-31 ENCOUNTER — Ambulatory Visit (INDEPENDENT_AMBULATORY_CARE_PROVIDER_SITE_OTHER): Payer: Medicare Other | Admitting: Internal Medicine

## 2019-10-31 VITALS — BP 119/80 | HR 88 | Wt 144.0 lb

## 2019-10-31 DIAGNOSIS — N183 Chronic kidney disease, stage 3 unspecified: Secondary | ICD-10-CM

## 2019-10-31 DIAGNOSIS — B20 Human immunodeficiency virus [HIV] disease: Secondary | ICD-10-CM

## 2019-10-31 NOTE — Progress Notes (Signed)
RFV: follow up for HIV disease  Patient ID: Isaac Miller, male   DOB: 1966/03/12, 54 y.o.   MRN: [id number]  HPI Isaac Miller is a 54yo M with HIV disease, with htn, cd 4 count of 570/VL<20, on biktarvy. He reports that he is taking his medications. Denies having any issues. He is living with his brother. He is in soiled clothing during this visit.  Outpatient Encounter Medications as of 10/31/2019  Medication Sig  . atorvastatin (LIPITOR) 40 MG tablet TAKE 1 TABLET(40 MG) BY MOUTH DAILY  . BIKTARVY 50-200-25 MG TABS tablet TAKE 1 TABLET BY MOUTH DAILY. STOP GENVOYA  . buPROPion (WELLBUTRIN SR) 150 MG 12 hr tablet TAKE 1 TABLET(150 MG) BY MOUTH TWICE DAILY  . hydrochlorothiazide (HYDRODIURIL) 25 MG tablet TAKE 1 TABLET(25 MG) BY MOUTH DAILY  . sulfamethoxazole-trimethoprim (BACTRIM) 400-80 MG tablet Take 1 tablet by mouth 2 (two) times daily.   No facility-administered encounter medications on file as of 10/31/2019.     Patient Active Problem List   Diagnosis Date Noted  . GERD (gastroesophageal reflux disease) 06/25/2014  . Hyperlipidemia with target LDL less than 100 07/23/2012  . Hyperglycemia 07/23/2012  . Medication monitoring encounter 10/08/2010  . FRACTURE NOS, CLOSED 10/24/2006  . History of traumatic head injury 10/24/2006  . Human immunodeficiency virus (HIV) disease (HCC) 09/18/2006     Health Maintenance Due  Topic Date Due  . COVID-19 Vaccine (1) Never done  . TETANUS/TDAP  Never done  . COLONOSCOPY  Never done     Review of Systems Review of Systems  Constitutional: Negative for fever, chills, diaphoresis, activity change, appetite change, fatigue and unexpected weight change.  HENT: Negative for congestion, sore throat, rhinorrhea, sneezing, trouble swallowing and sinus pressure.  Eyes: Negative for photophobia and visual disturbance.  Respiratory: Negative for cough, chest tightness, shortness of breath, wheezing and stridor.  Cardiovascular: Negative for chest  pain, palpitations and leg swelling.  Gastrointestinal: Negative for nausea, vomiting, abdominal pain, diarrhea, constipation, blood in stool, abdominal distention and anal bleeding.  Genitourinary: Negative for dysuria, hematuria, flank pain and difficulty urinating.  Musculoskeletal: Negative for myalgias, back pain, joint swelling, arthralgias and gait problem.  Skin: Negative for color change, pallor, rash and wound.  Neurological: Negative for dizziness, tremors, weakness and light-headedness.  Hematological: Negative for adenopathy. Does not bruise/bleed easily.  Psychiatric/Behavioral: Negative for behavioral problems, confusion, sleep disturbance, dysphoric mood, decreased concentration and agitation.    Physical Exam   There were no vitals taken for this visit.  Physical Exam  Constitutional: He is oriented to person, place, and time. He appears well-developed and well-nourished. No distress. Though he is in soiled clothing HENT:  Mouth/Throat: Oropharynx is clear and moist. No oropharyngeal exudate.  Cardiovascular: Normal rate, regular rhythm and normal heart sounds. Exam reveals no gallop and no friction rub.  No murmur heard.  Pulmonary/Chest: Effort normal and breath sounds normal. No respiratory distress. He has no wheezes.  Abdominal: Soft. Bowel sounds are normal. He exhibits no distension. There is no tenderness.  Lymphadenopathy:  He has no cervical adenopathy.  Neurological: He is alert and oriented to person, place, and time.  Skin: Skin is warm and dry. No rash noted. No erythema.  Psychiatric: He has a normal mood and affect. His behavior is normal.    Lab Results  Component Value Date   CD4TCELL 31 (L) 05/15/2018   Lab Results  Component Value Date   CD4TABS 570 05/15/2018   CD4TABS 430 02/12/2018  CD4TABS 450 10/04/2017   Lab Results  Component Value Date   HIV1RNAQUANT <20 DETECTED (A) 05/15/2018   Lab Results  Component Value Date   HEPBSAB POS  (A) 12/17/2013   Lab Results  Component Value Date   LABRPR NON-REACTIVE 10/04/2017    CBC Lab Results  Component Value Date   WBC 7.0 08/15/2019   RBC 5.24 08/15/2019   HGB 13.8 08/15/2019   HCT 42.5 08/15/2019   PLT 248 08/15/2019   MCV 81.1 08/15/2019   MCH 26.3 (L) 08/15/2019   MCHC 32.5 08/15/2019   RDW 15.8 (H) 08/15/2019   LYMPHSABS 1,477 08/15/2019   MONOABS 360 11/03/2016   EOSABS 28 08/15/2019    BMET Lab Results  Component Value Date   NA 137 08/15/2019   K 4.3 08/15/2019   CL 104 08/15/2019   CO2 18 (L) 08/15/2019   GLUCOSE 79 08/15/2019   BUN 25 08/15/2019   CREATININE 1.78 (H) 08/15/2019   CALCIUM 9.8 08/15/2019   GFRNONAA 47 (L) 02/12/2018   GFRAA 55 (L) 02/12/2018      Assessment and Plan  hiv disease = will plan to give him his medications through [facility] for illiteracy  Will check labs  ckd 3 = will check cr function  Long term meds = will check cr and cbc for side effects  Social hx = will chech with family any needs

## 2019-11-01 LAB — T-HELPER CELL (CD4) - (RCID CLINIC ONLY)
CD4 % Helper T Cell: 22 % — ABNORMAL LOW (ref 33–65)
CD4 T Cell Abs: 295 /uL — ABNORMAL LOW (ref 400–1790)

## 2019-11-03 ENCOUNTER — Other Ambulatory Visit: Payer: Self-pay | Admitting: Internal Medicine

## 2019-11-06 LAB — RPR: RPR Ser Ql: NONREACTIVE

## 2019-11-06 LAB — CBC WITH DIFFERENTIAL/PLATELET
Absolute Monocytes: 459 cells/uL (ref 200–950)
Basophils Absolute: 39 cells/uL (ref 0–200)
Basophils Relative: 0.7 %
Eosinophils Absolute: 67 cells/uL (ref 15–500)
Eosinophils Relative: 1.2 %
HCT: 43.9 % (ref 38.5–50.0)
Hemoglobin: 14.3 g/dL (ref 13.2–17.1)
Lymphs Abs: 1294 cells/uL (ref 850–3900)
MCH: 28 pg (ref 27.0–33.0)
MCHC: 32.6 g/dL (ref 32.0–36.0)
MCV: 86.1 fL (ref 80.0–100.0)
MPV: 11.8 fL (ref 7.5–12.5)
Monocytes Relative: 8.2 %
Neutro Abs: 3741 cells/uL (ref 1500–7800)
Neutrophils Relative %: 66.8 %
Platelets: 253 10*3/uL (ref 140–400)
RBC: 5.1 10*6/uL (ref 4.20–5.80)
RDW: 15.5 % — ABNORMAL HIGH (ref 11.0–15.0)
Total Lymphocyte: 23.1 %
WBC: 5.6 10*3/uL (ref 3.8–10.8)

## 2019-11-06 LAB — COMPLETE METABOLIC PANEL WITH GFR
AG Ratio: 1.3 (calc) (ref 1.0–2.5)
ALT: 24 U/L (ref 9–46)
AST: 21 U/L (ref 10–35)
Albumin: 4.4 g/dL (ref 3.6–5.1)
Alkaline phosphatase (APISO): 100 U/L (ref 35–144)
BUN/Creatinine Ratio: 16 (calc) (ref 6–22)
BUN: 32 mg/dL — ABNORMAL HIGH (ref 7–25)
CO2: 26 mmol/L (ref 20–32)
Calcium: 10 mg/dL (ref 8.6–10.3)
Chloride: 108 mmol/L (ref 98–110)
Creat: 1.98 mg/dL — ABNORMAL HIGH (ref 0.70–1.33)
GFR, Est African American: 43 mL/min/{1.73_m2} — ABNORMAL LOW (ref >=60)
GFR, Est Non African American: 37 mL/min/{1.73_m2} — ABNORMAL LOW (ref >=60)
Globulin: 3.4 g/dL (calc) (ref 1.9–3.7)
Glucose, Bld: 95 mg/dL (ref 65–99)
Potassium: 4 mmol/L (ref 3.5–5.3)
Sodium: 146 mmol/L (ref 135–146)
Total Bilirubin: 0.3 mg/dL (ref 0.2–1.2)
Total Protein: 7.8 g/dL (ref 6.1–8.1)

## 2019-11-06 LAB — HIV-1 RNA QUANT-NO REFLEX-BLD
HIV 1 RNA Quant: 28 copies/mL — ABNORMAL HIGH
HIV-1 RNA Quant, Log: 1.45 Log copies/mL — ABNORMAL HIGH

## 2019-11-12 ENCOUNTER — Other Ambulatory Visit: Payer: Self-pay | Admitting: Internal Medicine

## 2019-12-08 ENCOUNTER — Other Ambulatory Visit: Payer: Self-pay | Admitting: Internal Medicine

## 2019-12-24 ENCOUNTER — Telehealth: Payer: Self-pay

## 2019-12-24 ENCOUNTER — Ambulatory Visit (INDEPENDENT_AMBULATORY_CARE_PROVIDER_SITE_OTHER): Payer: Medicare Other | Admitting: Nurse Practitioner

## 2019-12-24 ENCOUNTER — Other Ambulatory Visit: Payer: Self-pay

## 2019-12-24 ENCOUNTER — Encounter: Payer: Self-pay | Admitting: Nurse Practitioner

## 2019-12-24 DIAGNOSIS — Z0000 Encounter for general adult medical examination without abnormal findings: Secondary | ICD-10-CM

## 2019-12-24 DIAGNOSIS — Z1211 Encounter for screening for malignant neoplasm of colon: Secondary | ICD-10-CM | POA: Diagnosis not present

## 2019-12-24 DIAGNOSIS — Z1212 Encounter for screening for malignant neoplasm of rectum: Secondary | ICD-10-CM | POA: Diagnosis not present

## 2019-12-24 NOTE — Patient Instructions (Signed)
Isaac Miller , Thank you for taking time to come for your Medicare Wellness Visit. I appreciate your ongoing commitment to your health goals. Please review the following plan we discussed and let me know if I can assist you in the future.   Screening recommendations/referrals: Colonoscopy DUE; referral placed at this time Recommended yearly ophthalmology/optometry visit for glaucoma screening and checkup Recommended yearly dental visit for hygiene and checkup  Vaccinations: Influenza vaccine DUE at this time, to get at local pharmacy or in office Pneumococcal vaccine up to date Tdap vaccine DUE at this time, to get at local pharmacy-- would recommend getting COVID series first  Shingles vaccine Due at this time, to get at local pharmacy--- would recommend getting COVID series first   COVID VACCINE DUE you can call [phone] for information about getting the covid vaccine through [facility] at the [facility].  There will eventually be a way to arrange an appointment electronically, as well.  I expect you will receive a mychart message, as well, about this soon.   Advanced directives: recommended to complete paperwork and bring to office to place on file  Conditions/risks identified: needing colorectal screening, eye exam and vaccines  Next appointment: DUE for follow up with Dr Renato Gails and 1 year for AWV   Preventive Care 55 Years and Older, Male Preventive care refers to lifestyle choices and visits with your health care provider that can promote health and wellness. What does preventive care include?  A yearly physical exam. This is also called an annual well check.  Dental exams once or twice a year.  Routine eye exams. Ask your health care provider how often you should have your eyes checked.  Personal lifestyle choices, including:  Daily care of your teeth and gums.  Regular physical activity.  Eating a healthy diet.  Avoiding tobacco and drug use.  Limiting  alcohol use.  Practicing safe sex.  Taking low doses of aspirin every day.  Taking vitamin and mineral supplements as recommended by your health care provider. What happens during an annual well check? The services and screenings done by your health care provider during your annual well check will depend on your age, overall health, lifestyle risk factors, and family history of disease. Counseling  Your health care provider may ask you questions about your:  Alcohol use.  Tobacco use.  Drug use.  Emotional well-being.  Home and relationship well-being.  Sexual activity.  Eating habits.  History of falls.  Memory and ability to understand (cognition).  Work and work Astronomer. Screening  You may have the following tests or measurements:  Height, weight, and BMI.  Blood pressure.  Lipid and cholesterol levels. These may be checked every 5 years, or more frequently if you are over 27 years old.  Skin check.  Lung cancer screening. You may have this screening every year starting at age 40 if you have a 30-pack-year history of smoking and currently smoke or have quit within the past 15 years.  Fecal occult blood test (FOBT) of the stool. You may have this test every year starting at age 49.  Flexible sigmoidoscopy or colonoscopy. You may have a sigmoidoscopy every 5 years or a colonoscopy every 10 years starting at age 1.  Prostate cancer screening. Recommendations will vary depending on your family history and other risks.  Hepatitis C blood test.  Hepatitis B blood test.  Sexually transmitted disease (STD) testing.  Diabetes screening. This is done by checking your blood sugar (glucose)  after you have not eaten for a while (fasting). You may have this done every 1-3 years.  Abdominal aortic aneurysm (AAA) screening. You may need this if you are a current or former smoker.  Osteoporosis. You may be screened starting at age 78 if you are at high risk. Talk  with your health care provider about your test results, treatment options, and if necessary, the need for more tests. Vaccines  Your health care provider may recommend certain vaccines, such as:  Influenza vaccine. This is recommended every year.  Tetanus, diphtheria, and acellular pertussis (Tdap, Td) vaccine. You may need a Td booster every 10 years.  Zoster vaccine. You may need this after age 38.  Pneumococcal 13-valent conjugate (PCV13) vaccine. One dose is recommended after age 48.  Pneumococcal polysaccharide (PPSV23) vaccine. One dose is recommended after age 58. Talk to your health care provider about which screenings and vaccines you need and how often you need them. This information is not intended to replace advice given to you by your health care provider. Make sure you discuss any questions you have with your health care provider. Document Released: 04/17/2015 Document Revised: 12/09/2015 Document Reviewed: 01/20/2015 Elsevier Interactive Patient Education  2017 [employer].  Fall Prevention in the Home Falls can cause injuries. They can happen to people of all ages. There are many things you can do to make your home safe and to help prevent falls. What can I do on the outside of my home?  Regularly fix the edges of walkways and driveways and fix any cracks.  Remove anything that might make you trip as you walk through a door, such as a raised step or threshold.  Trim any bushes or trees on the path to your home.  Use bright outdoor lighting.  Clear any walking paths of anything that might make someone trip, such as rocks or tools.  Regularly check to see if handrails are loose or broken. Make sure that both sides of any steps have handrails.  Any raised decks and porches should have guardrails on the edges.  Have any leaves, snow, or ice cleared regularly.  Use sand or salt on walking paths during winter.  Clean up any spills in your garage right away. This  includes oil or grease spills. What can I do in the bathroom?  Use night lights.  Install grab bars by the toilet and in the tub and shower. Do not use towel bars as grab bars.  Use non-skid mats or decals in the tub or shower.  If you need to sit down in the shower, use a plastic, non-slip stool.  Keep the floor dry. Clean up any water that spills on the floor as soon as it happens.  Remove soap buildup in the tub or shower regularly.  Attach bath mats securely with double-sided non-slip rug tape.  Do not have throw rugs and other things on the floor that can make you trip. What can I do in the bedroom?  Use night lights.  Make sure that you have a light by your bed that is easy to reach.  Do not use any sheets or blankets that are too big for your bed. They should not hang down onto the floor.  Have a firm chair that has side arms. You can use this for support while you get dressed.  Do not have throw rugs and other things on the floor that can make you trip. What can I do in the kitchen?  Clean up any spills right away.  Avoid walking on wet floors.  Keep items that you use a lot in easy-to-reach places.  If you need to reach something above you, use a strong step stool that has a grab bar.  Keep electrical cords out of the way.  Do not use floor polish or wax that makes floors slippery. If you must use wax, use non-skid floor wax.  Do not have throw rugs and other things on the floor that can make you trip. What can I do with my stairs?  Do not leave any items on the stairs.  Make sure that there are handrails on both sides of the stairs and use them. Fix handrails that are broken or loose. Make sure that handrails are as long as the stairways.  Check any carpeting to make sure that it is firmly attached to the stairs. Fix any carpet that is loose or worn.  Avoid having throw rugs at the top or bottom of the stairs. If you do have throw rugs, attach them to the  floor with carpet tape.  Make sure that you have a light switch at the top of the stairs and the bottom of the stairs. If you do not have them, ask someone to add them for you. What else can I do to help prevent falls?  Wear shoes that:  Do not have high heels.  Have rubber bottoms.  Are comfortable and fit you well.  Are closed at the toe. Do not wear sandals.  If you use a stepladder:  Make sure that it is fully opened. Do not climb a closed stepladder.  Make sure that both sides of the stepladder are locked into place.  Ask someone to hold it for you, if possible.  Clearly mark and make sure that you can see:  Any grab bars or handrails.  First and last steps.  Where the edge of each step is.  Use tools that help you move around (mobility aids) if they are needed. These include:  Canes.  Walkers.  Scooters.  Crutches.  Turn on the lights when you go into a dark area. Replace any light bulbs as soon as they burn out.  Set up your furniture so you have a clear path. Avoid moving your furniture around.  If any of your floors are uneven, fix them.  If there are any pets around you, be aware of where they are.  Review your medicines with your doctor. Some medicines can make you feel dizzy. This can increase your chance of falling. Ask your doctor what other things that you can do to help prevent falls. This information is not intended to replace advice given to you by your health care provider. Make sure you discuss any questions you have with your health care provider. Document Released: 01/15/2009 Document Revised: 08/27/2015 Document Reviewed: 04/25/2014 Elsevier Interactive Patient Education  2017 [employer].

## 2019-12-24 NOTE — Progress Notes (Signed)
This service is provided via telemedicine  No vital signs collected/recorded due to the encounter was a telemedicine visit.   Location of patient (ex: home, work):  Home  Patient consents to a telephone visit:  Yes, see encounter dated 12/24/2019  Location of the provider (ex: office, home):  [facility]  Name of any referring provider:  Bufford Spikes, DO  Names of all persons participating in the telemedicine service and their role in the encounter:  Abbey Chatters, Nurse Practitioner, Elveria Royals, CMA, patient. Aurther Loft patient's brother.  Time spent on call:  8 minutes with medical assistant
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
## 2019-12-24 NOTE — Telephone Encounter (Signed)
Mr. jordynn, perrier are scheduled for a virtual visit with your provider today.    Just as we do with appointments in the office, we must obtain your consent to participate.  Your consent will be active for this visit and any virtual visit you may have with one of our providers in the next 365 days.    If you have a MyChart account, I can also send a copy of this consent to you electronically.  All virtual visits are billed to your insurance company just like a traditional visit in the office.  As this is a virtual visit, video technology does not allow for your provider to perform a traditional examination.  This may limit your provider's ability to fully assess your condition.  If your provider identifies any concerns that need to be evaluated in person or the need to arrange testing such as labs, EKG, etc, we will make arrangements to do so.    Although advances in technology are sophisticated, we cannot ensure that it will always work on either your end or our end.  If the connection with a video visit is poor, we may have to switch to a telephone visit.  With either a video or telephone visit, we are not always able to ensure that we have a secure connection.   I need to obtain your verbal consent now.   Are you willing to proceed with your visit today?   DIONNE ROSSA has provided verbal consent on 12/24/2019 for a virtual visit (video or telephone).   Elveria Royals, [facility] 12/24/2019  8:37 AM

## 2020-01-07 ENCOUNTER — Telehealth: Payer: Self-pay | Admitting: Internal Medicine

## 2020-01-07 NOTE — Telephone Encounter (Signed)
I called to see if we could schedule a follow appt for him anytime soon he shouldve had one in sep.

## 2020-02-06 ENCOUNTER — Ambulatory Visit: Payer: Medicare Other | Admitting: Internal Medicine

## 2020-02-06 ENCOUNTER — Other Ambulatory Visit: Payer: Self-pay | Admitting: Internal Medicine

## 2020-02-07 NOTE — Telephone Encounter (Signed)
Patient is under the care of specialist for this medication

## 2020-02-10 ENCOUNTER — Encounter: Payer: Self-pay | Admitting: Internal Medicine

## 2020-02-10 ENCOUNTER — Telehealth: Payer: Self-pay

## 2020-02-10 MED ORDER — BIKTARVY 50-200-25 MG PO TABS: ORAL_TABLET | ORAL | 0 refills | Status: DC

## 2020-02-10 NOTE — Telephone Encounter (Signed)
The only pharmacy I know that bubble wraps is [facility]

## 2020-02-10 NOTE — Telephone Encounter (Signed)
Per Dr. Feliz Beam last note prefers medications bubble wrapped for patient literacy. Medication called into pharmacy for medication refill, but they do not bubble wrap meds. Routing to pharmacy to see how they may able to assist.  Valarie Cones

## 2020-02-15 ENCOUNTER — Other Ambulatory Visit: Payer: Self-pay | Admitting: Internal Medicine

## 2020-02-20 ENCOUNTER — Encounter: Payer: Self-pay | Admitting: Internal Medicine

## 2020-02-20 ENCOUNTER — Other Ambulatory Visit: Payer: Self-pay

## 2020-02-20 ENCOUNTER — Ambulatory Visit (INDEPENDENT_AMBULATORY_CARE_PROVIDER_SITE_OTHER): Payer: Medicare Other | Admitting: Internal Medicine

## 2020-02-20 VITALS — BP 122/78 | HR 67 | Temp 98.4°F | Ht 64.0 in | Wt 147.0 lb

## 2020-02-20 DIAGNOSIS — Z9183 Wandering in diseases classified elsewhere: Secondary | ICD-10-CM

## 2020-02-20 DIAGNOSIS — Z23 Encounter for immunization: Secondary | ICD-10-CM | POA: Diagnosis not present

## 2020-02-20 DIAGNOSIS — B20 Human immunodeficiency virus [HIV] disease: Secondary | ICD-10-CM

## 2020-02-20 DIAGNOSIS — F02818 Dementia in other diseases classified elsewhere, unspecified severity, with other behavioral disturbance: Secondary | ICD-10-CM

## 2020-02-20 DIAGNOSIS — E785 Hyperlipidemia, unspecified: Secondary | ICD-10-CM | POA: Diagnosis not present

## 2020-02-20 DIAGNOSIS — F03918 Unspecified dementia, unspecified severity, with other behavioral disturbance: Secondary | ICD-10-CM

## 2020-02-20 DIAGNOSIS — F0391 Unspecified dementia with behavioral disturbance: Secondary | ICD-10-CM

## 2020-02-20 DIAGNOSIS — F0281 Dementia in other diseases classified elsewhere with behavioral disturbance: Secondary | ICD-10-CM | POA: Diagnosis not present
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
## 2020-02-20 NOTE — Patient Instructions (Signed)
Please get your covid vaccines ASAP.

## 2020-02-21 LAB — LIPID PANEL
Cholesterol: 153 mg/dL (ref <200)
HDL: 67 mg/dL (ref >=40)
LDL Cholesterol (Calc): 73 mg/dL (calc)
Non-HDL Cholesterol (Calc): 86 mg/dL (calc) (ref <130)
Total CHOL/HDL Ratio: 2.3 (calc) (ref <5.0)
Triglycerides: 54 mg/dL (ref <150)

## 2020-02-21 NOTE — Progress Notes (Signed)
Notify his sister-in-law:  Cholesterol panel looks good.  Continue his medications the same.

## 2020-02-25 ENCOUNTER — Ambulatory Visit: Payer: Medicare Other | Admitting: Internal Medicine

## 2020-03-09 ENCOUNTER — Ambulatory Visit: Payer: Medicare Other | Admitting: Internal Medicine

## 2020-03-16 ENCOUNTER — Ambulatory Visit: Payer: Medicare Other | Admitting: Internal Medicine

## 2020-04-02 ENCOUNTER — Encounter: Payer: Self-pay | Admitting: *Deleted

## 2020-04-02 ENCOUNTER — Ambulatory Visit (INDEPENDENT_AMBULATORY_CARE_PROVIDER_SITE_OTHER): Payer: Medicare Other | Admitting: Pharmacist

## 2020-04-02 ENCOUNTER — Other Ambulatory Visit: Payer: Self-pay

## 2020-04-02 DIAGNOSIS — B20 Human immunodeficiency virus [HIV] disease: Secondary | ICD-10-CM

## 2020-04-02 MED ORDER — BIKTARVY 50-200-25 MG PO TABS: ORAL_TABLET | ORAL | 6 refills | Status: DC

## 2020-04-02 NOTE — Progress Notes (Signed)
HPI: Isaac Miller is a 54 y.o. male who presents to the [facility] pharmacy clinic for HIV follow-up.  Patient Active Problem List   Diagnosis Date Noted  . GERD (gastroesophageal reflux disease) 06/25/2014  . Hyperlipidemia with target LDL less than 100 07/23/2012  . Hyperglycemia 07/23/2012  . Medication monitoring encounter 10/08/2010  . FRACTURE NOS, CLOSED 10/24/2006  . History of traumatic head injury 10/24/2006  . Human immunodeficiency virus (HIV) disease (HCC) 09/18/2006    Patient's Medications  New Prescriptions   No medications on file  Previous Medications   ATORVASTATIN (LIPITOR) 40 MG TABLET    TAKE 1 TABLET(40 MG) BY MOUTH DAILY   BICTEGRAVIR-EMTRICITABINE-TENOFOVIR AF (BIKTARVY) 50-200-25 MG TABS TABLET    Take 1 tablet by mouth Daily   BUPROPION (WELLBUTRIN SR) 150 MG 12 HR TABLET    TAKE 1 TABLET(150 MG) BY MOUTH TWICE DAILY   HYDROCHLOROTHIAZIDE (HYDRODIURIL) 25 MG TABLET    TAKE 1 TABLET(25 MG) BY MOUTH DAILY  Modified Medications   No medications on file  Discontinued Medications   No medications on file    Allergies: No Known Allergies  Past Medical History: Past Medical History:  Diagnosis Date  . Abnormality of gait   . Depressive disorder, not elsewhere classified   . Dysphagia, oral phase   . Encounter for long-term (current) use of other medications   . Human immunodeficiency virus (HIV) disease (HCC)   . Hyperosmolality and/or hypernatremia   . Impacted cerumen   . Injury to unspecified blood vessel of head and neck   . Insomnia, unspecified   . Moderate intellectual disabilities   . Onychia and paronychia of toe   . Other abnormal blood chemistry   . Other and unspecified hyperlipidemia   . Other malaise and fatigue   . Pain in limb   . Reflux esophagitis   . Unspecified disorder of liver   . Unspecified essential hypertension   . Unspecified nonpsychotic mental disorder   . Unspecified psychosis     Social History: Social History    Socioeconomic History  . Marital status: Divorced    Spouse name: Not on file  . Number of children: Not on file  . Years of education: Not on file  . Highest education level: Not on file  Occupational History  . Not on file  Tobacco Use  . Smoking status: Former Smoker    Years: 5.00    Types: Cigarettes  . Smokeless tobacco: Never Used  . Tobacco comment: 1 cig here and there   Substance and Sexual Activity  . Alcohol use: Not Currently    Alcohol/week: 0.0 standard drinks  . Drug use: Yes    Types: Hydrocodone    Comment: Still does it on occasion  . Sexual activity: Never    Comment: declined condoms  Other Topics Concern  . Not on file  Social History Narrative  . Not on file   Social Determinants of Health   Financial Resource Strain: Not on file  Food Insecurity: Not on file  Transportation Needs: Not on file  Physical Activity: Not on file  Stress: Not on file  Social Connections: Not on file    Labs: Lab Results  Component Value Date   HIV1RNAQUANT 28 (H) 10/31/2019   HIV1RNAQUANT <20 DETECTED (A) 05/15/2018   HIV1RNAQUANT 60 (H) 02/12/2018   CD4TABS 295 (L) 10/31/2019   CD4TABS 570 05/15/2018   CD4TABS 430 02/12/2018    RPR and STI Lab Results  Component  Value Date   LABRPR NON-REACTIVE 10/31/2019   LABRPR NON-REACTIVE 10/04/2017   LABRPR NON REAC 11/03/2016   LABRPR NON REAC 03/15/2016   LABRPR NON REAC 02/11/2016    STI Results GC CT  02/11/2016 NOT DETECTED NOT DETECTED    Hepatitis B Lab Results  Component Value Date   HEPBSAB POS (A) 12/17/2013   HEPBSAG NO 05/29/2006   Hepatitis C Lab Results  Component Value Date   HEPCAB NON-REACTIVE 10/04/2017   Hepatitis A No results found for: HAV Lipids: Lab Results  Component Value Date   CHOL 153 02/20/2020   TRIG 54 02/20/2020   HDL 67 02/20/2020   CHOLHDL 2.3 02/20/2020   VLDL 15 02/11/2016   LDLCALC 73 02/20/2020    Current HIV Regimen: Biktarvy  Assessment: Isaac Miller  is here today to follow up for his HIV infection after several missed appointments with Dr. Drue Second. He was last seen by Dr. Drue Second in July. He comes in today with his brother's girlfriend who helps him fill his pill box each week and helps with his medications. He states that he is tolerating his Biktarvy well and taking it every day. I checked his medication list and it is up to date. She asked for a refill of all his medications, so I refilled his Biktarvy but asked that she reach out to his PCP to get refills of the rest of his medications. Will check labs today and have him see Dr. Drue Second in ~6 weeks.  Plan: - HIV viral load - Continue Biktavy - F/u with Dr. Drue Second 3/7 at 9am  Erving Sassano L. Riki Berninger, PharmD, BCIDP, AAHIVP, CPP Clinical Pharmacist Practitioner Infectious Diseases Clinical Pharmacist [facility] 04/02/2020, 3:29 PM

## 2020-04-13 LAB — HIV-1 RNA QUANT-NO REFLEX-BLD
HIV 1 RNA Quant: 38 Copies/mL — ABNORMAL HIGH
HIV-1 RNA Quant, Log: 1.58 Log cps/mL — ABNORMAL HIGH

## 2020-04-14 ENCOUNTER — Ambulatory Visit: Payer: Medicare Other | Admitting: Internal Medicine

## 2020-05-07 DIAGNOSIS — R2689 Other abnormalities of gait and mobility: Secondary | ICD-10-CM | POA: Diagnosis not present

## 2020-05-07 DIAGNOSIS — Z87820 Personal history of traumatic brain injury: Secondary | ICD-10-CM | POA: Diagnosis not present

## 2020-05-07 DIAGNOSIS — R569 Unspecified convulsions: Secondary | ICD-10-CM | POA: Diagnosis not present

## 2020-05-07 DIAGNOSIS — R488 Other symbolic dysfunctions: Secondary | ICD-10-CM | POA: Diagnosis not present

## 2020-05-07 DIAGNOSIS — E876 Hypokalemia: Secondary | ICD-10-CM | POA: Diagnosis not present

## 2020-05-07 DIAGNOSIS — Z823 Family history of stroke: Secondary | ICD-10-CM | POA: Diagnosis not present

## 2020-05-07 DIAGNOSIS — R279 Unspecified lack of coordination: Secondary | ICD-10-CM | POA: Diagnosis not present

## 2020-05-07 DIAGNOSIS — I129 Hypertensive chronic kidney disease with stage 1 through stage 4 chronic kidney disease, or unspecified chronic kidney disease: Secondary | ICD-10-CM | POA: Diagnosis not present

## 2020-05-07 DIAGNOSIS — F79 Unspecified intellectual disabilities: Secondary | ICD-10-CM | POA: Diagnosis not present

## 2020-05-07 DIAGNOSIS — E860 Dehydration: Secondary | ICD-10-CM | POA: Diagnosis not present

## 2020-05-07 DIAGNOSIS — E870 Hyperosmolality and hypernatremia: Secondary | ICD-10-CM | POA: Diagnosis not present

## 2020-05-07 DIAGNOSIS — J159 Unspecified bacterial pneumonia: Secondary | ICD-10-CM | POA: Diagnosis not present

## 2020-05-07 DIAGNOSIS — F0390 Unspecified dementia without behavioral disturbance: Secondary | ICD-10-CM | POA: Diagnosis not present

## 2020-05-07 DIAGNOSIS — R042 Hemoptysis: Secondary | ICD-10-CM | POA: Insufficient documentation

## 2020-05-07 DIAGNOSIS — Z20822 Contact with and (suspected) exposure to covid-19: Secondary | ICD-10-CM | POA: Diagnosis not present

## 2020-05-07 DIAGNOSIS — Z79899 Other long term (current) drug therapy: Secondary | ICD-10-CM | POA: Diagnosis not present

## 2020-05-07 DIAGNOSIS — J189 Pneumonia, unspecified organism: Secondary | ICD-10-CM | POA: Diagnosis not present

## 2020-05-07 DIAGNOSIS — R4182 Altered mental status, unspecified: Secondary | ICD-10-CM | POA: Diagnosis not present

## 2020-05-07 DIAGNOSIS — Z743 Need for continuous supervision: Secondary | ICD-10-CM | POA: Diagnosis not present

## 2020-05-07 DIAGNOSIS — H548 Legal blindness, as defined in USA: Secondary | ICD-10-CM | POA: Diagnosis not present

## 2020-05-07 DIAGNOSIS — I517 Cardiomegaly: Secondary | ICD-10-CM | POA: Diagnosis not present

## 2020-05-07 DIAGNOSIS — N183 Chronic kidney disease, stage 3 unspecified: Secondary | ICD-10-CM | POA: Diagnosis not present

## 2020-05-07 DIAGNOSIS — G3184 Mild cognitive impairment, so stated: Secondary | ICD-10-CM | POA: Diagnosis not present

## 2020-05-07 DIAGNOSIS — G9389 Other specified disorders of brain: Secondary | ICD-10-CM | POA: Diagnosis not present

## 2020-05-07 DIAGNOSIS — E785 Hyperlipidemia, unspecified: Secondary | ICD-10-CM | POA: Diagnosis not present

## 2020-05-07 DIAGNOSIS — W19XXXA Unspecified fall, initial encounter: Secondary | ICD-10-CM | POA: Diagnosis not present

## 2020-05-07 DIAGNOSIS — Z87891 Personal history of nicotine dependence: Secondary | ICD-10-CM | POA: Diagnosis not present

## 2020-05-07 DIAGNOSIS — R000 Tachycardia, unspecified: Secondary | ICD-10-CM | POA: Diagnosis not present

## 2020-05-07 DIAGNOSIS — M6281 Muscle weakness (generalized): Secondary | ICD-10-CM | POA: Diagnosis not present

## 2020-05-07 DIAGNOSIS — R5381 Other malaise: Secondary | ICD-10-CM | POA: Diagnosis not present

## 2020-05-07 DIAGNOSIS — R040 Epistaxis: Secondary | ICD-10-CM | POA: Diagnosis not present

## 2020-05-07 DIAGNOSIS — I10 Essential (primary) hypertension: Secondary | ICD-10-CM | POA: Diagnosis not present

## 2020-05-07 DIAGNOSIS — D649 Anemia, unspecified: Secondary | ICD-10-CM | POA: Diagnosis not present

## 2020-05-07 DIAGNOSIS — R2681 Unsteadiness on feet: Secondary | ICD-10-CM | POA: Diagnosis not present

## 2020-05-07 DIAGNOSIS — B20 Human immunodeficiency virus [HIV] disease: Secondary | ICD-10-CM | POA: Diagnosis not present

## 2020-05-07 DIAGNOSIS — Z9889 Other specified postprocedural states: Secondary | ICD-10-CM | POA: Diagnosis not present

## 2020-05-08 DIAGNOSIS — J159 Unspecified bacterial pneumonia: Secondary | ICD-10-CM | POA: Insufficient documentation

## 2020-05-08 DIAGNOSIS — N183 Chronic kidney disease, stage 3 unspecified: Secondary | ICD-10-CM | POA: Insufficient documentation

## 2020-05-15 DIAGNOSIS — G40909 Epilepsy, unspecified, not intractable, without status epilepticus: Secondary | ICD-10-CM | POA: Diagnosis not present

## 2020-05-15 DIAGNOSIS — R2681 Unsteadiness on feet: Secondary | ICD-10-CM | POA: Diagnosis not present

## 2020-05-15 DIAGNOSIS — R279 Unspecified lack of coordination: Secondary | ICD-10-CM | POA: Diagnosis not present

## 2020-05-15 DIAGNOSIS — I10 Essential (primary) hypertension: Secondary | ICD-10-CM | POA: Diagnosis not present

## 2020-05-15 DIAGNOSIS — N183 Chronic kidney disease, stage 3 unspecified: Secondary | ICD-10-CM | POA: Diagnosis not present

## 2020-05-15 DIAGNOSIS — D649 Anemia, unspecified: Secondary | ICD-10-CM | POA: Diagnosis not present

## 2020-05-15 DIAGNOSIS — B20 Human immunodeficiency virus [HIV] disease: Secondary | ICD-10-CM | POA: Diagnosis not present

## 2020-05-15 DIAGNOSIS — R569 Unspecified convulsions: Secondary | ICD-10-CM | POA: Diagnosis not present

## 2020-05-15 DIAGNOSIS — Z87820 Personal history of traumatic brain injury: Secondary | ICD-10-CM | POA: Diagnosis not present

## 2020-05-15 DIAGNOSIS — G3184 Mild cognitive impairment, so stated: Secondary | ICD-10-CM | POA: Diagnosis not present

## 2020-05-15 DIAGNOSIS — R5381 Other malaise: Secondary | ICD-10-CM | POA: Diagnosis not present

## 2020-05-15 DIAGNOSIS — M6281 Muscle weakness (generalized): Secondary | ICD-10-CM | POA: Diagnosis not present

## 2020-05-15 DIAGNOSIS — J189 Pneumonia, unspecified organism: Secondary | ICD-10-CM | POA: Diagnosis not present

## 2020-05-15 DIAGNOSIS — Z743 Need for continuous supervision: Secondary | ICD-10-CM | POA: Diagnosis not present

## 2020-05-15 DIAGNOSIS — R488 Other symbolic dysfunctions: Secondary | ICD-10-CM | POA: Diagnosis not present

## 2020-05-15 DIAGNOSIS — E785 Hyperlipidemia, unspecified: Secondary | ICD-10-CM | POA: Diagnosis not present

## 2020-05-15 DIAGNOSIS — G9389 Other specified disorders of brain: Secondary | ICD-10-CM | POA: Diagnosis not present

## 2020-05-15 DIAGNOSIS — R2689 Other abnormalities of gait and mobility: Secondary | ICD-10-CM | POA: Diagnosis not present

## 2020-05-15 DIAGNOSIS — Z79899 Other long term (current) drug therapy: Secondary | ICD-10-CM | POA: Diagnosis not present

## 2020-05-15 DIAGNOSIS — F32A Depression, unspecified: Secondary | ICD-10-CM | POA: Diagnosis not present

## 2020-05-15 DIAGNOSIS — R040 Epistaxis: Secondary | ICD-10-CM | POA: Diagnosis not present

## 2020-05-21 DIAGNOSIS — G3184 Mild cognitive impairment, so stated: Secondary | ICD-10-CM | POA: Diagnosis not present

## 2020-05-21 DIAGNOSIS — N183 Chronic kidney disease, stage 3 unspecified: Secondary | ICD-10-CM | POA: Diagnosis not present

## 2020-05-21 DIAGNOSIS — G40909 Epilepsy, unspecified, not intractable, without status epilepticus: Secondary | ICD-10-CM | POA: Diagnosis not present

## 2020-05-21 DIAGNOSIS — B20 Human immunodeficiency virus [HIV] disease: Secondary | ICD-10-CM | POA: Diagnosis not present

## 2020-05-21 DIAGNOSIS — R040 Epistaxis: Secondary | ICD-10-CM | POA: Diagnosis not present

## 2020-05-21 DIAGNOSIS — Z87820 Personal history of traumatic brain injury: Secondary | ICD-10-CM | POA: Diagnosis not present

## 2020-05-21 DIAGNOSIS — E785 Hyperlipidemia, unspecified: Secondary | ICD-10-CM | POA: Diagnosis not present

## 2020-05-21 DIAGNOSIS — J189 Pneumonia, unspecified organism: Secondary | ICD-10-CM | POA: Diagnosis not present

## 2020-05-25 ENCOUNTER — Encounter: Payer: Self-pay | Admitting: Internal Medicine

## 2020-05-26 DIAGNOSIS — B20 Human immunodeficiency virus [HIV] disease: Secondary | ICD-10-CM | POA: Diagnosis not present

## 2020-05-26 DIAGNOSIS — N183 Chronic kidney disease, stage 3 unspecified: Secondary | ICD-10-CM | POA: Diagnosis not present

## 2020-05-26 DIAGNOSIS — D649 Anemia, unspecified: Secondary | ICD-10-CM | POA: Diagnosis not present

## 2020-05-26 DIAGNOSIS — G40909 Epilepsy, unspecified, not intractable, without status epilepticus: Secondary | ICD-10-CM | POA: Diagnosis not present

## 2020-05-29 ENCOUNTER — Other Ambulatory Visit: Payer: Self-pay

## 2020-05-29 ENCOUNTER — Encounter: Payer: Self-pay | Admitting: Internal Medicine

## 2020-05-29 ENCOUNTER — Ambulatory Visit (INDEPENDENT_AMBULATORY_CARE_PROVIDER_SITE_OTHER): Payer: Self-pay | Admitting: Internal Medicine

## 2020-05-29 ENCOUNTER — Telehealth: Payer: Self-pay | Admitting: *Deleted

## 2020-05-29 VITALS — BP 112/64 | HR 58 | Ht 64.0 in | Wt 147.4 lb

## 2020-05-29 DIAGNOSIS — Z1211 Encounter for screening for malignant neoplasm of colon: Secondary | ICD-10-CM

## 2020-05-29 DIAGNOSIS — D638 Anemia in other chronic diseases classified elsewhere: Secondary | ICD-10-CM

## 2020-05-29 DIAGNOSIS — D509 Iron deficiency anemia, unspecified: Secondary | ICD-10-CM

## 2020-05-29 DIAGNOSIS — B20 Human immunodeficiency virus [HIV] disease: Secondary | ICD-10-CM

## 2020-05-29 MED ORDER — PLENVU 140 G PO SOLR: 1.0000 | ORAL | 0 refills | Status: DC

## 2020-05-29 NOTE — Patient Instructions (Signed)
You have been scheduled for an endoscopy and colonoscopy. Please follow the written instructions given to you at your visit today. Please pick up your prep supplies at the pharmacy within the next 1-3 days. If you use inhalers (even only as needed), please bring them with you on the day of your procedure.  If you are age 56 or younger, your body mass index should be between 19-25. Your Body mass index is 25.3 kg/m. If this is out of the aformentioned range listed, please consider follow up with your Primary Care Provider.   Due to recent changes in healthcare laws, you may see the results of your imaging and laboratory studies on MyChart before your provider has had a chance to review them.  We understand that in some cases there may be results that are confusing or concerning to you. Not all laboratory results come back in the same time frame and the provider may be waiting for multiple results in order to interpret others.  Please give [place] 48 hours in order for your provider to thoroughly review all the results before contacting the office for clarification of your results.

## 2020-05-29 NOTE — Progress Notes (Signed)
Patient ID: Isaac Miller, male   DOB: 10/31/1965, 55 y.o.   MRN: [id number] HPI: Gay Rape is a 55 year old male with a history of prior TBI, HIV disease on antiviral therapy, seizure disorder, who is seen in consult at the request of Dr. Renato Gails to evaluate anemia.  He is here today with a CNA from his nursing facility.  He lives at [facility].  He was hospitalized in early February 2022 after his brother found blood which was felt to be from hemoptysis.  It was later determined that this was probably epistaxis misrepresented for hemoptysis.  He was treated for possible pneumonia with a short course of azithromycin.  During this hospitalization it was noted that his hemoglobin had declined from a baseline of around 13 g/dL to 10 g/dL.  There was no gross blood noted in the stool but it was recommended that he follow-up with gastroenterology.  Also during the night and during his hospitalization he was found to have 2 episodes of [place] seizure activity.  There was a post ictal state.  Brain imaging was unremarkable and he has been started on Keppra.  He was discharged to a skilled nursing facility.  He reports that he does not have any specific GI complaint today.  However nursing records indicate that he may have intermittent issues with belching and upper abdominal bloating symptom.  He currently denies heartburn, dysphagia, odynophagia, abdominal pain.  Reports a good appetite.  Reports bowel movements which are regular though he does have MiraLAX and senna on his med list.  He also has ferrous gluconate on his med list.  He denies seeing visible blood in his stool or melena.  He is unaware of any significant family history of colon cancer.  There is no record of prior endoscopic procedure and he denies ever having had colonoscopy.  Past Medical History:  Diagnosis Date  . Abnormality of gait   . Depressive disorder, not elsewhere classified   . Dysphagia, oral phase   . Encounter  for long-term (current) use of other medications   . H/O traumatic subdural hematoma 2005  . History of subarachnoid hemorrhage 2005  . Human immunodeficiency virus (HIV) disease (HCC)   . Hyperosmolality and/or hypernatremia   . Impacted cerumen   . Injury to unspecified blood vessel of head and neck   . Insomnia, unspecified   . Moderate intellectual disabilities   . Onychia and paronychia of toe   . Other abnormal blood chemistry   . Other and unspecified hyperlipidemia   . Other malaise and fatigue   . Pain in limb   . Reflux esophagitis   . Unspecified disorder of liver   . Unspecified essential hypertension   . Unspecified nonpsychotic mental disorder   . Unspecified psychosis   . Ventilator dependence (HCC) 2005   history of     Past Surgical History:  Procedure Laterality Date  . CRANIOTOMY  2005   after jumping from bridge  . PEG PLACEMENT  2005  . Teeth pulled  2013  . TRACHEAL SURGERY  2005    Outpatient Medications Prior to Visit  Medication Sig Dispense Refill  . atorvastatin (LIPITOR) 40 MG tablet TAKE 1 TABLET(40 MG) BY MOUTH DAILY 90 tablet 1  . bictegravir-emtricitabine-tenofovir AF (BIKTARVY) 50-200-25 MG TABS tablet Take 1 tablet by mouth Daily 30 tablet 6  . buPROPion (WELLBUTRIN SR) 150 MG 12 hr tablet TAKE 1 TABLET(150 MG) BY MOUTH TWICE DAILY 60 tablet 5  . ferrous gluconate (  FERGON) 324 MG tablet Take 324 mg by mouth 2 (two) times daily with a meal.    . levETIRAcetam (KEPPRA) 750 MG tablet Take 750 mg by mouth 2 (two) times daily.    . polyethylene glycol powder (GLYCOLAX/MIRALAX) 17 GM/SCOOP powder Take 1 Container by mouth daily as needed.    . sennosides-docusate sodium (SENOKOT-S) 8.6-50 MG tablet Take 1 tablet by mouth daily.    . sertraline (ZOLOFT) 25 MG tablet Take 25 mg by mouth daily.    . hydrochlorothiazide (HYDRODIURIL) 25 MG tablet TAKE 1 TABLET(25 MG) BY MOUTH DAILY (Patient not taking: Reported on 05/29/2020) 90 tablet 1   No  facility-administered medications prior to visit.    No Known Allergies  Family History  Problem Relation Age of Onset  . Hypertension Mother   . Hypertension Father     Social History   Tobacco Use  . Smoking status: Current Every Day Smoker    Years: 5.00    Types: Cigarettes  . Smokeless tobacco: Never Used  . Tobacco comment: 1 cig here and there   Vaping Use  . Vaping Use: Never used  Substance Use Topics  . Alcohol use: Yes    Alcohol/week: 0.0 standard drinks  . Drug use: Yes    Types: Hydrocodone    Comment: Still does it on occasion; hx marijuana and cocaine abuse 2005 and previously    ROS: As per history of present illness, otherwise negative  BP 112/64 (BP Location: Left Arm, Patient Position: Sitting, Cuff Size: Normal)   Pulse (!) 58   Ht 5\' 4"  (1.626 m)   Wt 147 lb 6 oz (66.8 kg)   BMI 25.30 kg/m  Constitutional: Well-developed and well-nourished. No distress. HEENT:  Oropharynx is clear and moist. Conjunctivae are normal.  No scleral icterus. Cardiovascular: Normal rate, regular rhythm and intact distal pulses. No M/R/G Pulmonary/chest: Effort normal and breath sounds normal. No wheezing, rales or rhonchi. Abdominal: Soft, nontender, nondistended. Bowel sounds active throughout. There are no masses palpable. No hepatosplenomegaly. Extremities: no clubbing, cyanosis, or edema Neurological: Grossly intact moving all 4 extremities Skin: Skin is warm and dry.  Psychiatric: Normal mood and affect.    RELEVANT LABS AND IMAGING: CBC    Component Value Date/Time   WBC 5.6 10/31/2019 1612   RBC 5.10 10/31/2019 1612   HGB 14.3 10/31/2019 1612   HCT 43.9 10/31/2019 1612   PLT 253 10/31/2019 1612   MCV 86.1 10/31/2019 1612   MCH 28.0 10/31/2019 1612   MCHC 32.6 10/31/2019 1612   RDW 15.5 (H) 10/31/2019 1612   RDW 15.5 (H) 04/18/2014 1012   LYMPHSABS 1,294 10/31/2019 1612   LYMPHSABS 1.5 04/18/2014 1012   MONOABS 360 11/03/2016 0946   EOSABS 67  10/31/2019 1612   EOSABS 0.1 04/18/2014 1012   BASOSABS 39 10/31/2019 1612   BASOSABS 0.0 04/18/2014 1012    CMP     Component Value Date/Time   NA 146 10/31/2019 1612   NA 144 04/18/2014 1012   K 4.0 10/31/2019 1612   CL 108 10/31/2019 1612   CO2 26 10/31/2019 1612   GLUCOSE 95 10/31/2019 1612   BUN 32 (H) 10/31/2019 1612   BUN 20 04/18/2014 1012   CREATININE 1.98 (H) 10/31/2019 1612   CALCIUM 10.0 10/31/2019 1612   PROT 7.8 10/31/2019 1612   PROT 6.9 04/18/2014 1012   ALBUMIN 4.2 11/03/2016 0946   ALBUMIN 4.3 04/18/2014 1012   AST 21 10/31/2019 1612   ALT 24 10/31/2019  1612   ALKPHOS 100 11/03/2016 0946   BILITOT 0.3 10/31/2019 1612   GFRNONAA 37 (L) 10/31/2019 1612   GFRAA 43 (L) 10/31/2019 1612   Labs dated 05/13/2020: Hemoglobin 10.2, MCV 83.7 Ferritin 62, percent iron saturation 9, iron 27, transferrin 204 (the latter 3 indices low) Albumin 3.3  Liver enzymes normal on 05/07/2020 (total bili 0.4, AST 16, ALT 16, phosphatase 75)  ASSESSMENT/PLAN: 55 year old male with a history of prior TBI, HIV disease on antiviral therapy, seizure disorder, who is seen in consult at the request of Dr. Renato Gails to evaluate anemia.   1.  New anemia/question of GI blood loss --there was probable epistaxis which was discovered when he was hospitalized earlier this month.  He has had a 3 to 5 g drop in hemoglobin over a less than 1 year period.  He has never had colorectal cancer screening.  His iron and percent saturation was low, ferritin was normal and transferrin was low.  This is most consistent with anemia of chronic disease though he has been on oral iron replacement and so there could be a component of iron deficiency as well.  It is certainly reasonable to perform endoscopic evaluation both to evaluate his anemia and also for general colon cancer screening.  We discussed this at length today including the risk, benefits and alternatives and he is agreeable and wishes to proceed.  This will  need to be performed in the outpatient hospital setting with monitored anesthesia care given his seizure disorder. --Upper endoscopy and colonoscopy at [facility] with monitored anesthesia care in April 2022 --I recommend that his hemoglobin as well as ferritin be followed by his nursing facility to ensure stabilization and hopeful improvement.  2.  HIV --he is followed by Dr. Drue Second and currently on Biktarvy  3.  Seizure disorder --he has been started on Keppra and will be following with neurology     VO:ZDGU, Gwenith Spitz, Do [address],  [place] [address]

## 2020-05-29 NOTE — Telephone Encounter (Signed)
I have gone over detailed endoscopy/colonoscopy instructions with both the patient and designated caregiver, Neysa Bonito who was sent by patient's nursing home to be with him at his office visit today. I have sent with Neysa Bonito a copy of prep instructions detailing the time (7 am arrival due to rapid covid testing), date and location for the procedure) and have also provided a copy of after visit summary along with a printed script for plenvu and a medicare coupon for plenvu. In addition I have given ample time for any questions that [facility] or patient may have regarding their procedure prep and they deny any questions.   I have also spoken to Clance Boll, RN at [facility] who indicates that patient should arrive at 7 am on 07/21/20 so he can have rapid covid testing prior to his appointment for procedure at 11:30 am.

## 2020-06-02 DIAGNOSIS — N183 Chronic kidney disease, stage 3 unspecified: Secondary | ICD-10-CM | POA: Diagnosis not present

## 2020-06-02 DIAGNOSIS — D649 Anemia, unspecified: Secondary | ICD-10-CM | POA: Diagnosis not present

## 2020-06-02 DIAGNOSIS — E785 Hyperlipidemia, unspecified: Secondary | ICD-10-CM | POA: Diagnosis not present

## 2020-06-02 DIAGNOSIS — G3184 Mild cognitive impairment, so stated: Secondary | ICD-10-CM | POA: Diagnosis not present

## 2020-06-02 DIAGNOSIS — B20 Human immunodeficiency virus [HIV] disease: Secondary | ICD-10-CM | POA: Diagnosis not present

## 2020-06-02 DIAGNOSIS — Z87820 Personal history of traumatic brain injury: Secondary | ICD-10-CM | POA: Diagnosis not present

## 2020-06-02 DIAGNOSIS — F32A Depression, unspecified: Secondary | ICD-10-CM | POA: Diagnosis not present

## 2020-06-02 DIAGNOSIS — G40909 Epilepsy, unspecified, not intractable, without status epilepticus: Secondary | ICD-10-CM | POA: Diagnosis not present

## 2020-06-08 ENCOUNTER — Ambulatory Visit: Payer: Medicare Other | Admitting: Internal Medicine

## 2020-06-09 ENCOUNTER — Ambulatory Visit: Payer: Self-pay | Admitting: Internal Medicine

## 2020-06-16 ENCOUNTER — Other Ambulatory Visit: Payer: Self-pay | Admitting: Internal Medicine

## 2020-06-22 ENCOUNTER — Ambulatory Visit: Payer: Self-pay | Admitting: Internal Medicine

## 2020-06-24 DIAGNOSIS — E161 Other hypoglycemia: Secondary | ICD-10-CM | POA: Diagnosis not present

## 2020-06-24 DIAGNOSIS — N183 Chronic kidney disease, stage 3 unspecified: Secondary | ICD-10-CM | POA: Diagnosis not present

## 2020-06-24 DIAGNOSIS — I129 Hypertensive chronic kidney disease with stage 1 through stage 4 chronic kidney disease, or unspecified chronic kidney disease: Secondary | ICD-10-CM | POA: Diagnosis not present

## 2020-06-24 DIAGNOSIS — R410 Disorientation, unspecified: Secondary | ICD-10-CM | POA: Diagnosis not present

## 2020-06-24 DIAGNOSIS — E871 Hypo-osmolality and hyponatremia: Secondary | ICD-10-CM | POA: Diagnosis not present

## 2020-06-24 DIAGNOSIS — R404 Transient alteration of awareness: Secondary | ICD-10-CM | POA: Diagnosis not present

## 2020-06-24 DIAGNOSIS — G40909 Epilepsy, unspecified, not intractable, without status epilepticus: Secondary | ICD-10-CM | POA: Diagnosis not present

## 2020-06-24 DIAGNOSIS — E785 Hyperlipidemia, unspecified: Secondary | ICD-10-CM | POA: Diagnosis not present

## 2020-06-24 DIAGNOSIS — Z9114 Patient's other noncompliance with medication regimen: Secondary | ICD-10-CM | POA: Diagnosis not present

## 2020-06-24 DIAGNOSIS — Z79899 Other long term (current) drug therapy: Secondary | ICD-10-CM | POA: Diagnosis not present

## 2020-06-24 DIAGNOSIS — E162 Hypoglycemia, unspecified: Secondary | ICD-10-CM | POA: Diagnosis not present

## 2020-06-24 DIAGNOSIS — E860 Dehydration: Secondary | ICD-10-CM | POA: Diagnosis not present

## 2020-06-24 DIAGNOSIS — Z21 Asymptomatic human immunodeficiency virus [HIV] infection status: Secondary | ICD-10-CM | POA: Diagnosis not present

## 2020-06-24 DIAGNOSIS — R569 Unspecified convulsions: Secondary | ICD-10-CM | POA: Diagnosis not present

## 2020-06-24 DIAGNOSIS — Z87820 Personal history of traumatic brain injury: Secondary | ICD-10-CM | POA: Diagnosis not present

## 2020-06-24 DIAGNOSIS — G9389 Other specified disorders of brain: Secondary | ICD-10-CM | POA: Diagnosis not present

## 2020-06-24 DIAGNOSIS — G3184 Mild cognitive impairment, so stated: Secondary | ICD-10-CM | POA: Diagnosis not present

## 2020-06-24 DIAGNOSIS — E870 Hyperosmolality and hypernatremia: Secondary | ICD-10-CM | POA: Diagnosis not present

## 2020-06-24 DIAGNOSIS — R4182 Altered mental status, unspecified: Secondary | ICD-10-CM | POA: Diagnosis not present

## 2020-06-25 DIAGNOSIS — E870 Hyperosmolality and hypernatremia: Secondary | ICD-10-CM | POA: Diagnosis not present

## 2020-06-25 DIAGNOSIS — I129 Hypertensive chronic kidney disease with stage 1 through stage 4 chronic kidney disease, or unspecified chronic kidney disease: Secondary | ICD-10-CM | POA: Diagnosis not present

## 2020-06-25 DIAGNOSIS — G3184 Mild cognitive impairment, so stated: Secondary | ICD-10-CM | POA: Diagnosis not present

## 2020-06-25 DIAGNOSIS — R32 Unspecified urinary incontinence: Secondary | ICD-10-CM | POA: Diagnosis not present

## 2020-06-25 DIAGNOSIS — E785 Hyperlipidemia, unspecified: Secondary | ICD-10-CM | POA: Diagnosis not present

## 2020-06-25 DIAGNOSIS — R569 Unspecified convulsions: Secondary | ICD-10-CM | POA: Diagnosis not present

## 2020-06-25 DIAGNOSIS — Z87820 Personal history of traumatic brain injury: Secondary | ICD-10-CM | POA: Diagnosis not present

## 2020-06-25 DIAGNOSIS — R9431 Abnormal electrocardiogram [ECG] [EKG]: Secondary | ICD-10-CM | POA: Diagnosis not present

## 2020-06-25 DIAGNOSIS — G40409 Other generalized epilepsy and epileptic syndromes, not intractable, without status epilepticus: Secondary | ICD-10-CM | POA: Diagnosis not present

## 2020-06-25 DIAGNOSIS — B20 Human immunodeficiency virus [HIV] disease: Secondary | ICD-10-CM | POA: Diagnosis not present

## 2020-06-25 DIAGNOSIS — N183 Chronic kidney disease, stage 3 unspecified: Secondary | ICD-10-CM | POA: Diagnosis not present

## 2020-06-25 DIAGNOSIS — R4182 Altered mental status, unspecified: Secondary | ICD-10-CM | POA: Diagnosis not present

## 2020-06-26 DIAGNOSIS — E860 Dehydration: Secondary | ICD-10-CM | POA: Diagnosis not present

## 2020-06-26 DIAGNOSIS — R569 Unspecified convulsions: Secondary | ICD-10-CM | POA: Diagnosis not present

## 2020-06-26 DIAGNOSIS — N183 Chronic kidney disease, stage 3 unspecified: Secondary | ICD-10-CM | POA: Diagnosis not present

## 2020-06-26 DIAGNOSIS — Z9114 Patient's other noncompliance with medication regimen: Secondary | ICD-10-CM | POA: Diagnosis not present

## 2020-06-26 DIAGNOSIS — G40909 Epilepsy, unspecified, not intractable, without status epilepticus: Secondary | ICD-10-CM | POA: Diagnosis not present

## 2020-06-26 DIAGNOSIS — E870 Hyperosmolality and hypernatremia: Secondary | ICD-10-CM | POA: Diagnosis not present

## 2020-07-10 ENCOUNTER — Ambulatory Visit: Payer: Medicare Other | Admitting: Nurse Practitioner

## 2020-07-14 NOTE — Progress Notes (Signed)
Attempted to obtain medical history via telephone, unable to reach at this time.The name on the voicemail did not match the patient so I did not leave a message.

## 2020-07-15 ENCOUNTER — Telehealth: Payer: Self-pay | Admitting: Internal Medicine

## 2020-07-15 NOTE — Telephone Encounter (Signed)
Procedure cancelled at the hospital as requested.

## 2020-07-15 NOTE — Telephone Encounter (Signed)
Inbound call from patient's brother requesting to cancel procedure scheduled for 07/21/20 stating patient may be moving to another state.  If plans change will call back to reschedule.

## 2020-07-21 ENCOUNTER — Encounter (HOSPITAL_COMMUNITY): Admission: RE | Payer: Self-pay | Source: Home / Self Care

## 2020-07-21 ENCOUNTER — Ambulatory Visit (HOSPITAL_COMMUNITY): Admission: RE | Admit: 2020-07-21 | Payer: Medicare Other | Source: Home / Self Care | Admitting: Internal Medicine

## 2020-07-21 SURGERY — ESOPHAGOGASTRODUODENOSCOPY (EGD) WITH PROPOFOL
Anesthesia: Monitor Anesthesia Care

## 2020-07-22 ENCOUNTER — Ambulatory Visit: Payer: Medicare Other | Admitting: Nurse Practitioner

## 2020-07-31 ENCOUNTER — Ambulatory Visit: Payer: Medicare Other | Admitting: Nurse Practitioner

## 2020-08-05 ENCOUNTER — Ambulatory Visit: Payer: Self-pay | Admitting: Nurse Practitioner

## 2020-08-19 ENCOUNTER — Ambulatory Visit: Payer: Medicare Other | Admitting: Nurse Practitioner

## 2020-08-20 ENCOUNTER — Ambulatory Visit: Payer: Medicare Other | Admitting: Internal Medicine

## 2020-09-21 ENCOUNTER — Ambulatory Visit: Payer: Self-pay | Admitting: Nurse Practitioner

## 2020-10-07 ENCOUNTER — Other Ambulatory Visit: Payer: Self-pay | Admitting: Pharmacist

## 2020-10-07 ENCOUNTER — Telehealth: Payer: Self-pay

## 2020-10-07 DIAGNOSIS — B20 Human immunodeficiency virus [HIV] disease: Secondary | ICD-10-CM

## 2020-10-07 NOTE — Telephone Encounter (Signed)
Received refill request for Biktarvy. Patient is overdue for follow up. Called patient to schedule with Dr. Drue Second, no answer and voicemail greeting did not match patient's name. Unable to leave voicemail at this time.   Sandie Ano, RN

## 2020-12-05 ENCOUNTER — Other Ambulatory Visit: Payer: Self-pay | Admitting: Internal Medicine

## 2020-12-19 ENCOUNTER — Other Ambulatory Visit: Payer: Self-pay | Admitting: Internal Medicine

## 2020-12-19 DIAGNOSIS — B20 Human immunodeficiency virus [HIV] disease: Secondary | ICD-10-CM

## 2021-01-28 ENCOUNTER — Telehealth: Payer: Self-pay | Admitting: Nurse Practitioner

## 2021-01-28 NOTE — Telephone Encounter (Signed)
I called patient and left a vm stating we needed to schedule a follow up visit. Patient has not been  seen since 02/20/2020 by Renato Gails. Canceled 6 appts.

## 2021-02-19 ENCOUNTER — Ambulatory Visit: Payer: Medicare (Managed Care) | Admitting: Family

## 2021-02-24 ENCOUNTER — Ambulatory Visit: Payer: Medicare (Managed Care) | Admitting: Family Medicine

## 2021-03-23 ENCOUNTER — Ambulatory Visit: Payer: Medicare (Managed Care) | Admitting: Family Medicine

## 2021-04-06 ENCOUNTER — Ambulatory Visit: Payer: Medicare (Managed Care) | Admitting: Family

## 2021-04-23 ENCOUNTER — Ambulatory Visit: Payer: Medicare (Managed Care) | Admitting: Family

## 2021-04-29 ENCOUNTER — Ambulatory Visit (INDEPENDENT_AMBULATORY_CARE_PROVIDER_SITE_OTHER): Payer: Medicare (Managed Care) | Admitting: Family

## 2021-04-29 ENCOUNTER — Encounter: Payer: Self-pay | Admitting: Family

## 2021-04-29 ENCOUNTER — Other Ambulatory Visit: Payer: Self-pay

## 2021-04-29 VITALS — BP 140/80 | HR 85 | Temp 97.8°F | Resp 16 | Ht 64.0 in | Wt 147.8 lb

## 2021-04-29 DIAGNOSIS — F02818 Dementia in other diseases classified elsewhere, unspecified severity, with other behavioral disturbance: Secondary | ICD-10-CM | POA: Diagnosis not present

## 2021-04-29 DIAGNOSIS — B20 Human immunodeficiency virus [HIV] disease: Secondary | ICD-10-CM

## 2021-04-29 DIAGNOSIS — Z87828 Personal history of other (healed) physical injury and trauma: Secondary | ICD-10-CM

## 2021-04-29 DIAGNOSIS — I129 Hypertensive chronic kidney disease with stage 1 through stage 4 chronic kidney disease, or unspecified chronic kidney disease: Secondary | ICD-10-CM

## 2021-04-29 DIAGNOSIS — N183 Chronic kidney disease, stage 3 unspecified: Secondary | ICD-10-CM

## 2021-04-29 DIAGNOSIS — K219 Gastro-esophageal reflux disease without esophagitis: Secondary | ICD-10-CM | POA: Diagnosis not present

## 2021-04-29 DIAGNOSIS — Z1212 Encounter for screening for malignant neoplasm of rectum: Secondary | ICD-10-CM

## 2021-04-29 DIAGNOSIS — F339 Major depressive disorder, recurrent, unspecified: Secondary | ICD-10-CM | POA: Diagnosis not present

## 2021-04-29 DIAGNOSIS — H6123 Impacted cerumen, bilateral: Secondary | ICD-10-CM | POA: Diagnosis not present

## 2021-04-29 DIAGNOSIS — Z1211 Encounter for screening for malignant neoplasm of colon: Secondary | ICD-10-CM

## 2021-04-29 DIAGNOSIS — I10 Essential (primary) hypertension: Secondary | ICD-10-CM | POA: Insufficient documentation

## 2021-04-29 DIAGNOSIS — E611 Iron deficiency: Secondary | ICD-10-CM | POA: Diagnosis not present

## 2021-04-29 DIAGNOSIS — E785 Hyperlipidemia, unspecified: Secondary | ICD-10-CM | POA: Diagnosis not present

## 2021-04-29 DIAGNOSIS — Z23 Encounter for immunization: Secondary | ICD-10-CM | POA: Diagnosis not present

## 2021-04-29 DIAGNOSIS — L602 Onychogryphosis: Secondary | ICD-10-CM | POA: Diagnosis not present

## 2021-04-29 MED ORDER — DEBROX 6.5 % OT SOLN: 5.0000 [drp] | Freq: Two times a day (BID) | OTIC | 0 refills | Status: AC

## 2021-04-29 NOTE — Patient Instructions (Signed)
-   Instill debrox 6.5 otic solution 5 drops into each ear twice daily x 4 days then follow up for ear lavage.May apply cotton ball at bedtime to prevent drainage to pillow.  

## 2021-04-29 NOTE — Progress Notes (Signed)
Provider: Marlowe Sax FNP-C   Rocklyn Mayberry, Nelda Bucks, NP  Patient Care Team: Huey Scalia, Nelda Bucks, NP as PCP - General (Family Medicine) Carlyle Basques, MD as PCP - Infectious Diseases (Infectious Diseases)  Extended Emergency Contact Information Primary Emergency Contact: [facility] Address: [address] [place] [address] Johnnette Litter of [place] Phone: [phone] Relation: Mother Secondary Emergency Contact: [facility] Mobile Phone: [phone] Relation: Brother  Code Status:  Full Code  Goals of care: Advanced Directive information Advanced Directives 04/29/2021  Does Patient Have a Medical Advance Directive? No  Type of Advance Directive -  Does patient want to make changes to medical advance directive? -  Copy of [place] in Chart? -  Would patient like information on creating a medical advance directive? No - Patient declined     Chief Complaint  Patient presents with   Medical Management of Chronic Issues    Routine Visit.    Health Maintenance    Discuss the need for Colonoscopy.   Immunizations    Discuss the need for Covid vaccine, Singrix vaccine, and Influenza vaccine.    HPI:  Pt is a 56 y.o. male seen today for medical management of chronic diseases. He was previously following up with Dr.Reed tiffany last seen 02/20/2020.seeing him for the first time today.He is here with the brother Coralyn Mark. Has a medical history of Hyperlipidemia,Seizure,Traumatic brain injury,GERD,CKD stage 3 ,HIV ,major depression  He denies any acute issues   He lives with the brother and the mother who assist with his ADL's  Reports no seizure activity.seen in ED 06/24/2020 for seizure.  Has had no fall episode.Appetite is good.His weight is stable compared with previous visit. No wandering reported. On chart review missed follow up appointment with infectious disease.last visit 04/02/2020.    Past Medical History:  Diagnosis Date    Abnormality of gait    Depressive disorder, not elsewhere classified    Dysphagia, oral phase    Encounter for long-term (current) use of other medications    H/O traumatic subdural hematoma 2005   History of subarachnoid hemorrhage 2005   Human immunodeficiency virus (HIV) disease (HCC)    Hyperosmolality and/or hypernatremia    Impacted cerumen    Injury to unspecified blood vessel of head and neck    Insomnia, unspecified    Moderate intellectual disabilities    Onychia and paronychia of toe    Other abnormal blood chemistry    Other and unspecified hyperlipidemia    Other malaise and fatigue    Pain in limb    Reflux esophagitis    Unspecified disorder of liver    Unspecified essential hypertension    Unspecified nonpsychotic mental disorder    Unspecified psychosis    Ventilator dependence ([place]) 2005   history of    Past Surgical History:  Procedure Laterality Date   CRANIOTOMY  2005   after jumping from bridge   PEG PLACEMENT  2005   Teeth pulled  2013   TRACHEAL SURGERY  2005    No Known Allergies  Allergies as of 04/29/2021   No Known Allergies      Medication List        Accurate as of April 29, 2021  4:02 PM. If you have any questions, ask your nurse or doctor.          atorvastatin 40 MG tablet Commonly known as: LIPITOR TAKE 1 TABLET(40 MG) BY MOUTH DAILY  Biktarvy 50-200-25 MG Tabs tablet Generic drug: bictegravir-emtricitabine-tenofovir AF TAKE 1 TABLET BY MOUTH DAILY   Biktarvy 50-200-25 MG Tabs tablet Generic drug: bictegravir-emtricitabine-tenofovir AF TAKE 1 TABLET BY MOUTH DAILY. STOP GENVOYA   buPROPion 150 MG 12 hr tablet Commonly known as: WELLBUTRIN SR TAKE 1 TABLET(150 MG) BY MOUTH TWICE DAILY   ferrous gluconate 324 MG tablet Commonly known as: FERGON Take 324 mg by mouth 2 (two) times daily with a meal.   levETIRAcetam 750 MG tablet Commonly known as: KEPPRA Take 750 mg by mouth 2 (two) times daily.   Plenvu 140 g  Solr Generic drug: PEG-KCl-NaCl-NaSulf-Na Asc-C Take 1 kit by mouth as directed. Use coupon: BIN: [id number] PNC: CNRX Group: [id number] ID: [id number]   polyethylene glycol powder 17 GM/SCOOP powder Commonly known as: GLYCOLAX/MIRALAX Take 1 Container by mouth daily as needed.   sennosides-docusate sodium 8.6-50 MG tablet Commonly known as: SENOKOT-S Take 1 tablet by mouth daily.   sertraline 25 MG tablet Commonly known as: ZOLOFT Take 25 mg by mouth daily.        Review of Systems  Constitutional:  Negative for appetite change, chills, fatigue, fever and unexpected weight change.  HENT:  Positive for hearing loss. Negative for congestion, dental problem, ear discharge, ear pain, facial swelling, nosebleeds, postnasal drip, rhinorrhea, sinus pressure, sinus pain, sneezing, sore throat, tinnitus and trouble swallowing.   Eyes:  Negative for pain, discharge, redness, itching and visual disturbance.  Respiratory:  Negative for cough, chest tightness, shortness of breath and wheezing.   Cardiovascular:  Negative for chest pain, palpitations and leg swelling.  Gastrointestinal:  Negative for abdominal distention, abdominal pain, blood in stool, constipation, diarrhea, nausea and vomiting.  Endocrine: Negative for cold intolerance, heat intolerance, polydipsia, polyphagia and polyuria.  Genitourinary:  Negative for difficulty urinating, dysuria, flank pain, frequency and urgency.       Wears a diaper   Musculoskeletal:  Positive for gait problem. Negative for arthralgias, back pain, joint swelling, myalgias, neck pain and neck stiffness.  Skin:  Negative for color change, pallor, rash and wound.  Neurological:  Negative for dizziness, syncope, speech difficulty, weakness, light-headedness, numbness and headaches.  Hematological:  Does not bruise/bleed easily.  Psychiatric/Behavioral:  Positive for behavioral problems. Negative for agitation, confusion, hallucinations, self-injury, sleep  disturbance and suicidal ideas. The patient is not nervous/anxious.        Memory loss    Immunization History  Administered Date(s) Administered   H1N1 07/03/2008   Hepatitis B 12/28/2006, 07/03/2008, 12/18/2008   Influenza Split 01/03/2011, 01/04/2012   Influenza Whole 02/03/2005, 12/28/2006, 01/29/2010   Influenza,inj,Quad PF,6+ Mos 01/29/2013, 12/17/2013, 02/11/2016, 02/12/2018, 02/20/2020   Influenza-Unspecified 12/04/2014, 12/03/2016   Meningococcal Mcv4o 03/15/2016   PFIZER Comirnaty(Gray Top)Covid-19 Tri-Sucrose Vaccine 05/08/2020   Pneumococcal Conjugate-13 08/04/2014   Pneumococcal Polysaccharide-23 02/23/2005, 11/04/2010   Tdap 05/07/2020   Pertinent  Health Maintenance Due  Topic Date Due   COLONOSCOPY (Pts 45-15yrs Insurance coverage will need to be confirmed)  Never done   INFLUENZA VACCINE  11/02/2020   Fall Risk 04/30/2018 08/15/2019 12/24/2019 02/20/2020 04/29/2021  Falls in the past year? 0 1 1 0 0  Was there an injury with Fall? 0 1 1 0 0  Fall Risk Category Calculator 0 2 3 0 0  Fall Risk Category Low Moderate High Low Low  Patient Fall Risk Level - Moderate fall risk High fall risk - Low fall risk  Patient at Risk for Falls Due to - - - - No Fall Risks  Fall risk Follow up - - - - Falls evaluation completed   Functional Status Survey:    Vitals:   04/29/21 1559  BP: 140/80  Pulse: 85  Resp: 16  Temp: 97.8 F (36.6 C)  SpO2: 97%  Weight: 147 lb 12.8 oz (67 kg)  Height: $Remove'5\' 4"'LnGXxQU$  (1.626 m)   Body mass index is 25.37 kg/m. Physical Exam Vitals reviewed.  Constitutional:      General: He is not in acute distress.    Appearance: Normal appearance. He is overweight. He is not ill-appearing or diaphoretic.     Comments: Strong urine odor   HENT:     Head: Normocephalic.     Right Ear: There is impacted cerumen.     Left Ear: There is impacted cerumen.     Nose: Nose normal. No congestion or rhinorrhea.     Mouth/Throat:     Mouth: Mucous membranes are  moist.     Pharynx: Oropharynx is clear. No oropharyngeal exudate or posterior oropharyngeal erythema.  Eyes:     General: No scleral icterus.       Right eye: No discharge.        Left eye: No discharge.     Extraocular Movements: Extraocular movements intact.     Conjunctiva/sclera: Conjunctivae normal.     Pupils: Pupils are equal, round, and reactive to light.  Neck:     Vascular: No carotid bruit.  Cardiovascular:     Rate and Rhythm: Normal rate and regular rhythm.     Pulses: Normal pulses.     Heart sounds: Normal heart sounds. No murmur heard.   No friction rub. No gallop.  Pulmonary:     Effort: Pulmonary effort is normal. No respiratory distress.     Breath sounds: Normal breath sounds. No wheezing, rhonchi or rales.  Chest:     Chest wall: No tenderness.  Abdominal:     General: Bowel sounds are normal. There is no distension.     Palpations: Abdomen is soft. There is no mass.     Tenderness: There is no abdominal tenderness. There is no right CVA tenderness, left CVA tenderness, guarding or rebound.  Musculoskeletal:        General: No swelling or tenderness. Normal range of motion.     Cervical back: Normal range of motion. No rigidity or tenderness.     Right lower leg: No edema.     Left lower leg: No edema.  Feet:     Right foot:     Skin integrity: Callus and dry skin present. No ulcer, blister, skin breakdown, erythema or fissure.     Toenail Condition: Right toenails are abnormally thick and long. Fungal disease present.    Left foot:     Skin integrity: Callus and dry skin present. No ulcer, blister, skin breakdown, erythema or fissure.     Toenail Condition: Left toenails are abnormally thick and long. Fungal disease present. Lymphadenopathy:     Cervical: No cervical adenopathy.  Skin:    General: Skin is warm and dry.     Coloration: Skin is not pale.     Findings: No bruising, erythema, lesion or rash.  Neurological:     Mental Status: He is alert  and oriented to person, place, and time.     Cranial Nerves: No cranial nerve deficit.     Sensory: No sensory deficit.     Motor: No weakness.     Coordination: Coordination normal.  Gait: Gait abnormal.  Psychiatric:        Mood and Affect: Mood normal.        Speech: Speech normal.        Behavior: Behavior normal.        Thought Content: Thought content normal.        Cognition and Memory: Memory is impaired.        Judgment: Judgment normal.     Comments: Laughing out loudly inappropriately and repeating words     Labs reviewed: No results for input(s): NA, K, CL, CO2, GLUCOSE, BUN, CREATININE, CALCIUM, MG, PHOS in the last 8760 hours. No results for input(s): AST, ALT, ALKPHOS, BILITOT, PROT, ALBUMIN in the last 8760 hours. No results for input(s): WBC, NEUTROABS, HGB, HCT, MCV, PLT in the last 8760 hours. Lab Results  Component Value Date   TSH 0.957 04/18/2014   Lab Results  Component Value Date   HGBA1C 5.5 09/05/2017   Lab Results  Component Value Date   CHOL 153 02/20/2020   HDL 67 02/20/2020   LDLCALC 73 02/20/2020   TRIG 54 02/20/2020   CHOLHDL 2.3 02/20/2020    Significant Diagnostic Results in last 30 days:  No results found.  Assessment/Plan 1. Hyperlipidemia with target LDL less than 100 No recent labs for review  -continue on Atorvastatin  - Lipid panel; Future  2. Dementia associated with other underlying disease with behavioral disturbance Laughing out loudly and repeats words over and over.No aggressive behaviors reported  - continue on Sertraline and Wellbutrin  - CMP with eGFR([facility]); Future  3. Gastroesophageal reflux disease without esophagitis Reports no blood in the stool or dark stool  - CBC with Differential/Platelet; Future  4. Human immunodeficiency virus (HIV) disease ([place]) No recent appointment with Infectious disease. Recommend to schedule appointment with ID  - continue on Biktarvy  - CBC with Differential/Platelet;  Future - CMP with eGFR([facility]); Future  5. Iron deficiency Continue on Ferrous gluconate  - CBC with Differential/Platelet; Future  6. Depression, recurrent ([place]) Mood stable  Continue on sertraline and Wellbutrin  - TSH; Future  7. Need for influenza vaccination Afebrie Flut shot administered by CMA no acute reaction reported.   - Flu Vaccine QUAD 6+ mos PF IM (Fluarix Quad PF)  8. Overgrown toenails Thick yellow overgrown toenails on both feet affecting his gait will send referral to Podiatrist  - Ambulatory referral to Podiatry  9. Encounter for colorectal cancer screening Asymptomatic  - Ambulatory referral to Gastroenterology  10. Bilateral impacted cerumen Bilateral TM not visualized due to cerumen  - carbamide peroxide (DEBROX) 6.5 % OTIC solution; Place 5 drops into both ears 2 (two) times daily for 4 days.  Dispense: 15 mL; Refill: 0  11. History of traumatic head injury Continue on Keppra  No recent seizure reported   12. Benign hypertension with chronic kidney disease, stage III (HCC) B/p stable   Family/ staff Communication: Reviewed plan of care with patient and brother verbalized understanding   Labs/tests ordered: None   Next Appointment : 6 months for medical management of chronic issues.one week for Fasting Labs prior to visit and ear lavage.    Sandrea Hughs, NP

## 2021-05-05 ENCOUNTER — Other Ambulatory Visit: Payer: Self-pay

## 2021-05-05 ENCOUNTER — Ambulatory Visit (INDEPENDENT_AMBULATORY_CARE_PROVIDER_SITE_OTHER): Payer: Medicare (Managed Care) | Admitting: Podiatry

## 2021-05-05 ENCOUNTER — Encounter: Payer: Self-pay | Admitting: Family

## 2021-05-05 DIAGNOSIS — M79674 Pain in right toe(s): Secondary | ICD-10-CM

## 2021-05-05 DIAGNOSIS — M79675 Pain in left toe(s): Secondary | ICD-10-CM

## 2021-05-05 DIAGNOSIS — B351 Tinea unguium: Secondary | ICD-10-CM | POA: Diagnosis not present

## 2021-05-05 DIAGNOSIS — L989 Disorder of the skin and subcutaneous tissue, unspecified: Secondary | ICD-10-CM | POA: Diagnosis not present

## 2021-05-05 NOTE — Progress Notes (Signed)
° °  SUBJECTIVE Patient PMHx mild cognitive impairment presents to office today with his brother complaining of elongated, thickened nails that cause pain while ambulating in shoes.  Patient is unable to trim their own nails.  Patient's brother states that his nails have not been trimmed for well over 1 year.  Patient is here for further evaluation and treatment.  Past Medical History:  Diagnosis Date   Abnormality of gait    Depressive disorder, not elsewhere classified    Dysphagia, oral phase    Encounter for long-term (current) use of other medications    H/O traumatic subdural hematoma 2005   History of subarachnoid hemorrhage 2005   Human immunodeficiency virus (HIV) disease (HCC)    Hyperosmolality and/or hypernatremia    Impacted cerumen    Injury to unspecified blood vessel of head and neck    Insomnia, unspecified    Moderate intellectual disabilities    Onychia and paronychia of toe    Other abnormal blood chemistry    Other and unspecified hyperlipidemia    Other malaise and fatigue    Pain in limb    Reflux esophagitis    Unspecified disorder of liver    Unspecified essential hypertension    Unspecified nonpsychotic mental disorder    Unspecified psychosis    Ventilator dependence ([place]) 2005   history of     OBJECTIVE General Patient is awake, alert, and oriented x 3 and in no acute distress. Derm Skin is dry and supple bilateral. Negative open lesions or macerations. Remaining integument unremarkable. Nails are tender, long, thickened and dystrophic with subungual debris, consistent with onychomycosis, 1-5 bilateral. No signs of infection noted.  Hyperkeratotic callus tissue also noted to the distal tips of the toes along the bilateral feet Vasc  DP and PT pedal pulses palpable bilaterally. Temperature gradient within normal limits.  Neuro Epicritic and protective threshold sensation grossly intact bilaterally.  Musculoskeletal Exam No symptomatic pedal deformities  noted bilateral. Muscular strength within normal limits.  ASSESSMENT 1.  Pain due to onychomycosis of toenails both 2.  Preulcerative callus lesions bilateral feet  PLAN OF CARE 1. Patient evaluated today.  2. Instructed to maintain good pedal hygiene and foot care.  3. Mechanical debridement of nails 1-5 bilaterally performed using a nail nipper. Filed with dremel without incident.  4.  Excisional debridement of the hyperkeratotic callus tissue was performed showing a tissue nipper without incident or bleeding  5.  Return to clinic in 3 mos.    Edrick Kins, DPM [facility]  Dr. Edrick Kins, DPM    [address], [place] [address]                Office [phone]  Fax [phone]

## 2021-05-06 ENCOUNTER — Other Ambulatory Visit: Payer: Medicare (Managed Care)

## 2021-05-06 ENCOUNTER — Ambulatory Visit: Payer: Medicare (Managed Care) | Admitting: Family

## 2021-05-06 ENCOUNTER — Encounter: Payer: Medicare (Managed Care) | Admitting: Family

## 2021-05-07 NOTE — Progress Notes (Signed)
This encounter was created in error - please disregard.

## 2021-05-12 DIAGNOSIS — R600 Localized edema: Secondary | ICD-10-CM | POA: Diagnosis not present

## 2021-05-12 DIAGNOSIS — Z79899 Other long term (current) drug therapy: Secondary | ICD-10-CM | POA: Diagnosis not present

## 2021-05-12 DIAGNOSIS — R509 Fever, unspecified: Secondary | ICD-10-CM | POA: Diagnosis not present

## 2021-05-12 DIAGNOSIS — S8992XA Unspecified injury of left lower leg, initial encounter: Secondary | ICD-10-CM | POA: Diagnosis not present

## 2021-05-12 DIAGNOSIS — M85862 Other specified disorders of bone density and structure, left lower leg: Secondary | ICD-10-CM | POA: Diagnosis not present

## 2021-05-12 DIAGNOSIS — F32A Depression, unspecified: Secondary | ICD-10-CM | POA: Diagnosis not present

## 2021-05-12 DIAGNOSIS — Z9181 History of falling: Secondary | ICD-10-CM | POA: Diagnosis not present

## 2021-05-12 DIAGNOSIS — E871 Hypo-osmolality and hyponatremia: Secondary | ICD-10-CM | POA: Diagnosis not present

## 2021-05-12 DIAGNOSIS — F03911 Unspecified dementia, unspecified severity, with agitation: Secondary | ICD-10-CM | POA: Diagnosis not present

## 2021-05-12 DIAGNOSIS — E162 Hypoglycemia, unspecified: Secondary | ICD-10-CM | POA: Diagnosis not present

## 2021-05-12 DIAGNOSIS — N183 Chronic kidney disease, stage 3 unspecified: Secondary | ICD-10-CM | POA: Diagnosis not present

## 2021-05-12 DIAGNOSIS — W19XXXA Unspecified fall, initial encounter: Secondary | ICD-10-CM | POA: Diagnosis not present

## 2021-05-12 DIAGNOSIS — R2681 Unsteadiness on feet: Secondary | ICD-10-CM | POA: Diagnosis not present

## 2021-05-12 DIAGNOSIS — D649 Anemia, unspecified: Secondary | ICD-10-CM | POA: Diagnosis not present

## 2021-05-12 DIAGNOSIS — R4781 Slurred speech: Secondary | ICD-10-CM | POA: Diagnosis not present

## 2021-05-12 DIAGNOSIS — Z8249 Family history of ischemic heart disease and other diseases of the circulatory system: Secondary | ICD-10-CM | POA: Diagnosis not present

## 2021-05-12 DIAGNOSIS — E860 Dehydration: Secondary | ICD-10-CM | POA: Diagnosis not present

## 2021-05-12 DIAGNOSIS — S069XAS Unspecified intracranial injury with loss of consciousness status unknown, sequela: Secondary | ICD-10-CM | POA: Diagnosis not present

## 2021-05-12 DIAGNOSIS — Z87820 Personal history of traumatic brain injury: Secondary | ICD-10-CM | POA: Diagnosis not present

## 2021-05-12 DIAGNOSIS — F0390 Unspecified dementia without behavioral disturbance: Secondary | ICD-10-CM | POA: Diagnosis not present

## 2021-05-12 DIAGNOSIS — D631 Anemia in chronic kidney disease: Secondary | ICD-10-CM | POA: Diagnosis not present

## 2021-05-12 DIAGNOSIS — Z743 Need for continuous supervision: Secondary | ICD-10-CM | POA: Diagnosis not present

## 2021-05-12 DIAGNOSIS — T45515A Adverse effect of anticoagulants, initial encounter: Secondary | ICD-10-CM | POA: Diagnosis not present

## 2021-05-12 DIAGNOSIS — J690 Pneumonitis due to inhalation of food and vomit: Secondary | ICD-10-CM | POA: Diagnosis not present

## 2021-05-12 DIAGNOSIS — J90 Pleural effusion, not elsewhere classified: Secondary | ICD-10-CM | POA: Diagnosis not present

## 2021-05-12 DIAGNOSIS — N189 Chronic kidney disease, unspecified: Secondary | ICD-10-CM | POA: Diagnosis not present

## 2021-05-12 DIAGNOSIS — N179 Acute kidney failure, unspecified: Secondary | ICD-10-CM | POA: Diagnosis not present

## 2021-05-12 DIAGNOSIS — R404 Transient alteration of awareness: Secondary | ICD-10-CM | POA: Diagnosis not present

## 2021-05-12 DIAGNOSIS — R531 Weakness: Secondary | ICD-10-CM | POA: Diagnosis not present

## 2021-05-12 DIAGNOSIS — A419 Sepsis, unspecified organism: Secondary | ICD-10-CM | POA: Diagnosis not present

## 2021-05-12 DIAGNOSIS — G9341 Metabolic encephalopathy: Secondary | ICD-10-CM | POA: Diagnosis not present

## 2021-05-12 DIAGNOSIS — Z4682 Encounter for fitting and adjustment of non-vascular catheter: Secondary | ICD-10-CM | POA: Diagnosis not present

## 2021-05-12 DIAGNOSIS — G9389 Other specified disorders of brain: Secondary | ICD-10-CM | POA: Diagnosis not present

## 2021-05-12 DIAGNOSIS — N170 Acute kidney failure with tubular necrosis: Secondary | ICD-10-CM | POA: Diagnosis not present

## 2021-05-12 DIAGNOSIS — R262 Difficulty in walking, not elsewhere classified: Secondary | ICD-10-CM | POA: Diagnosis not present

## 2021-05-12 DIAGNOSIS — G9340 Encephalopathy, unspecified: Secondary | ICD-10-CM | POA: Diagnosis not present

## 2021-05-12 DIAGNOSIS — E870 Hyperosmolality and hypernatremia: Secondary | ICD-10-CM | POA: Diagnosis not present

## 2021-05-12 DIAGNOSIS — S069X9A Unspecified intracranial injury with loss of consciousness of unspecified duration, initial encounter: Secondary | ICD-10-CM | POA: Diagnosis not present

## 2021-05-12 DIAGNOSIS — Z751 Person awaiting admission to adequate facility elsewhere: Secondary | ICD-10-CM | POA: Diagnosis not present

## 2021-05-12 DIAGNOSIS — R4182 Altered mental status, unspecified: Secondary | ICD-10-CM | POA: Diagnosis not present

## 2021-05-12 DIAGNOSIS — E872 Acidosis, unspecified: Secondary | ICD-10-CM | POA: Diagnosis not present

## 2021-05-12 DIAGNOSIS — I10 Essential (primary) hypertension: Secondary | ICD-10-CM | POA: Diagnosis not present

## 2021-05-12 DIAGNOSIS — E878 Other disorders of electrolyte and fluid balance, not elsewhere classified: Secondary | ICD-10-CM | POA: Diagnosis not present

## 2021-05-12 DIAGNOSIS — G40909 Epilepsy, unspecified, not intractable, without status epilepticus: Secondary | ICD-10-CM | POA: Diagnosis not present

## 2021-05-12 DIAGNOSIS — E8729 Other acidosis: Secondary | ICD-10-CM | POA: Diagnosis not present

## 2021-05-12 DIAGNOSIS — J9601 Acute respiratory failure with hypoxia: Secondary | ICD-10-CM | POA: Diagnosis not present

## 2021-05-12 DIAGNOSIS — I129 Hypertensive chronic kidney disease with stage 1 through stage 4 chronic kidney disease, or unspecified chronic kidney disease: Secondary | ICD-10-CM | POA: Diagnosis not present

## 2021-05-12 DIAGNOSIS — R1311 Dysphagia, oral phase: Secondary | ICD-10-CM | POA: Diagnosis not present

## 2021-05-12 DIAGNOSIS — D509 Iron deficiency anemia, unspecified: Secondary | ICD-10-CM | POA: Diagnosis not present

## 2021-05-12 DIAGNOSIS — I959 Hypotension, unspecified: Secondary | ICD-10-CM | POA: Diagnosis not present

## 2021-05-12 DIAGNOSIS — D75821 Non-immune heparin-induced thrombocytopenia: Secondary | ICD-10-CM | POA: Diagnosis not present

## 2021-05-12 DIAGNOSIS — E876 Hypokalemia: Secondary | ICD-10-CM | POA: Diagnosis not present

## 2021-05-12 DIAGNOSIS — F0393 Unspecified dementia, unspecified severity, with mood disturbance: Secondary | ICD-10-CM | POA: Diagnosis not present

## 2021-05-12 DIAGNOSIS — R410 Disorientation, unspecified: Secondary | ICD-10-CM | POA: Diagnosis not present

## 2021-05-12 DIAGNOSIS — B20 Human immunodeficiency virus [HIV] disease: Secondary | ICD-10-CM | POA: Diagnosis not present

## 2021-05-12 DIAGNOSIS — G9349 Other encephalopathy: Secondary | ICD-10-CM | POA: Diagnosis not present

## 2021-05-12 DIAGNOSIS — R41841 Cognitive communication deficit: Secondary | ICD-10-CM | POA: Diagnosis not present

## 2021-05-12 DIAGNOSIS — Z823 Family history of stroke: Secondary | ICD-10-CM | POA: Diagnosis not present

## 2021-05-12 DIAGNOSIS — D696 Thrombocytopenia, unspecified: Secondary | ICD-10-CM | POA: Diagnosis not present

## 2021-05-12 DIAGNOSIS — E785 Hyperlipidemia, unspecified: Secondary | ICD-10-CM | POA: Diagnosis not present

## 2021-05-12 DIAGNOSIS — R488 Other symbolic dysfunctions: Secondary | ICD-10-CM | POA: Diagnosis not present

## 2021-05-12 DIAGNOSIS — R2689 Other abnormalities of gait and mobility: Secondary | ICD-10-CM | POA: Diagnosis not present

## 2021-05-12 DIAGNOSIS — M6281 Muscle weakness (generalized): Secondary | ICD-10-CM | POA: Diagnosis not present

## 2021-05-25 DIAGNOSIS — F32A Depression, unspecified: Secondary | ICD-10-CM | POA: Diagnosis not present

## 2021-05-25 DIAGNOSIS — N170 Acute kidney failure with tubular necrosis: Secondary | ICD-10-CM | POA: Diagnosis not present

## 2021-05-25 DIAGNOSIS — R1311 Dysphagia, oral phase: Secondary | ICD-10-CM | POA: Diagnosis not present

## 2021-05-25 DIAGNOSIS — R569 Unspecified convulsions: Secondary | ICD-10-CM | POA: Diagnosis not present

## 2021-05-25 DIAGNOSIS — R2681 Unsteadiness on feet: Secondary | ICD-10-CM | POA: Diagnosis not present

## 2021-05-25 DIAGNOSIS — T45515A Adverse effect of anticoagulants, initial encounter: Secondary | ICD-10-CM | POA: Diagnosis not present

## 2021-05-25 DIAGNOSIS — Z743 Need for continuous supervision: Secondary | ICD-10-CM | POA: Diagnosis not present

## 2021-05-25 DIAGNOSIS — R2689 Other abnormalities of gait and mobility: Secondary | ICD-10-CM | POA: Diagnosis not present

## 2021-05-25 DIAGNOSIS — G9389 Other specified disorders of brain: Secondary | ICD-10-CM | POA: Diagnosis not present

## 2021-05-25 DIAGNOSIS — D75821 Non-immune heparin-induced thrombocytopenia: Secondary | ICD-10-CM | POA: Diagnosis not present

## 2021-05-25 DIAGNOSIS — R262 Difficulty in walking, not elsewhere classified: Secondary | ICD-10-CM | POA: Diagnosis not present

## 2021-05-25 DIAGNOSIS — E785 Hyperlipidemia, unspecified: Secondary | ICD-10-CM | POA: Diagnosis not present

## 2021-05-25 DIAGNOSIS — G9341 Metabolic encephalopathy: Secondary | ICD-10-CM | POA: Diagnosis not present

## 2021-05-25 DIAGNOSIS — I129 Hypertensive chronic kidney disease with stage 1 through stage 4 chronic kidney disease, or unspecified chronic kidney disease: Secondary | ICD-10-CM | POA: Diagnosis not present

## 2021-05-25 DIAGNOSIS — I10 Essential (primary) hypertension: Secondary | ICD-10-CM | POA: Diagnosis not present

## 2021-05-25 DIAGNOSIS — R531 Weakness: Secondary | ICD-10-CM | POA: Diagnosis not present

## 2021-05-25 DIAGNOSIS — F0150 Vascular dementia without behavioral disturbance: Secondary | ICD-10-CM | POA: Diagnosis not present

## 2021-05-25 DIAGNOSIS — G9349 Other encephalopathy: Secondary | ICD-10-CM | POA: Diagnosis not present

## 2021-05-25 DIAGNOSIS — D649 Anemia, unspecified: Secondary | ICD-10-CM | POA: Diagnosis not present

## 2021-05-25 DIAGNOSIS — N179 Acute kidney failure, unspecified: Secondary | ICD-10-CM | POA: Diagnosis not present

## 2021-05-25 DIAGNOSIS — E870 Hyperosmolality and hypernatremia: Secondary | ICD-10-CM | POA: Diagnosis not present

## 2021-05-25 DIAGNOSIS — K219 Gastro-esophageal reflux disease without esophagitis: Secondary | ICD-10-CM | POA: Diagnosis not present

## 2021-05-25 DIAGNOSIS — R5381 Other malaise: Secondary | ICD-10-CM | POA: Diagnosis not present

## 2021-05-25 DIAGNOSIS — A419 Sepsis, unspecified organism: Secondary | ICD-10-CM | POA: Diagnosis not present

## 2021-05-25 DIAGNOSIS — R488 Other symbolic dysfunctions: Secondary | ICD-10-CM | POA: Diagnosis not present

## 2021-05-25 DIAGNOSIS — D631 Anemia in chronic kidney disease: Secondary | ICD-10-CM | POA: Diagnosis not present

## 2021-05-25 DIAGNOSIS — R609 Edema, unspecified: Secondary | ICD-10-CM | POA: Diagnosis not present

## 2021-05-25 DIAGNOSIS — R41841 Cognitive communication deficit: Secondary | ICD-10-CM | POA: Diagnosis not present

## 2021-05-25 DIAGNOSIS — Z87820 Personal history of traumatic brain injury: Secondary | ICD-10-CM | POA: Diagnosis not present

## 2021-05-25 DIAGNOSIS — E878 Other disorders of electrolyte and fluid balance, not elsewhere classified: Secondary | ICD-10-CM | POA: Diagnosis not present

## 2021-05-25 DIAGNOSIS — I959 Hypotension, unspecified: Secondary | ICD-10-CM | POA: Diagnosis not present

## 2021-05-25 DIAGNOSIS — B20 Human immunodeficiency virus [HIV] disease: Secondary | ICD-10-CM | POA: Diagnosis not present

## 2021-05-25 DIAGNOSIS — M6281 Muscle weakness (generalized): Secondary | ICD-10-CM | POA: Diagnosis not present

## 2021-05-25 DIAGNOSIS — E872 Acidosis, unspecified: Secondary | ICD-10-CM | POA: Diagnosis not present

## 2021-05-25 DIAGNOSIS — N183 Chronic kidney disease, stage 3 unspecified: Secondary | ICD-10-CM | POA: Diagnosis not present

## 2021-05-26 DIAGNOSIS — F32A Depression, unspecified: Secondary | ICD-10-CM | POA: Diagnosis not present

## 2021-05-26 DIAGNOSIS — N183 Chronic kidney disease, stage 3 unspecified: Secondary | ICD-10-CM | POA: Diagnosis not present

## 2021-05-26 DIAGNOSIS — E870 Hyperosmolality and hypernatremia: Secondary | ICD-10-CM | POA: Diagnosis not present

## 2021-05-26 DIAGNOSIS — E785 Hyperlipidemia, unspecified: Secondary | ICD-10-CM | POA: Diagnosis not present

## 2021-05-26 DIAGNOSIS — R569 Unspecified convulsions: Secondary | ICD-10-CM | POA: Diagnosis not present

## 2021-05-26 DIAGNOSIS — K219 Gastro-esophageal reflux disease without esophagitis: Secondary | ICD-10-CM | POA: Diagnosis not present

## 2021-05-26 DIAGNOSIS — I10 Essential (primary) hypertension: Secondary | ICD-10-CM | POA: Diagnosis not present

## 2021-05-26 DIAGNOSIS — D649 Anemia, unspecified: Secondary | ICD-10-CM | POA: Diagnosis not present

## 2021-06-01 DIAGNOSIS — N183 Chronic kidney disease, stage 3 unspecified: Secondary | ICD-10-CM | POA: Diagnosis not present

## 2021-06-01 DIAGNOSIS — I10 Essential (primary) hypertension: Secondary | ICD-10-CM | POA: Diagnosis not present

## 2021-06-01 DIAGNOSIS — E870 Hyperosmolality and hypernatremia: Secondary | ICD-10-CM | POA: Diagnosis not present

## 2021-06-01 DIAGNOSIS — F32A Depression, unspecified: Secondary | ICD-10-CM | POA: Diagnosis not present

## 2021-06-01 DIAGNOSIS — K219 Gastro-esophageal reflux disease without esophagitis: Secondary | ICD-10-CM | POA: Diagnosis not present

## 2021-06-01 DIAGNOSIS — D649 Anemia, unspecified: Secondary | ICD-10-CM | POA: Diagnosis not present

## 2021-06-01 DIAGNOSIS — E872 Acidosis, unspecified: Secondary | ICD-10-CM | POA: Diagnosis not present

## 2021-06-01 DIAGNOSIS — R569 Unspecified convulsions: Secondary | ICD-10-CM | POA: Diagnosis not present

## 2021-06-07 ENCOUNTER — Telehealth: Payer: Self-pay | Admitting: *Deleted

## 2021-06-07 DIAGNOSIS — R609 Edema, unspecified: Secondary | ICD-10-CM | POA: Diagnosis not present

## 2021-06-07 NOTE — Telephone Encounter (Signed)
-----   Message from Caesar Bookman, NP sent at 06/07/2021  9:14 AM EST ----- ?Regarding: need office visit follow up post hospital admission ?Synetta Fail , ?Mr.Wyles will need Hospital follow up.On discharge states PT /OT recommended SNF  ?Please find out if he was discharged home or SNF from [facility]. ?Thanks  ?Dinah  ? ?----- Message ----- ?From: Tera Partridge ?Sent: 06/07/2021   7:56 AM EST ?To: Caesar Bookman, NP ? ? ?

## 2021-06-07 NOTE — Telephone Encounter (Signed)
Noted. Thanks.

## 2021-06-07 NOTE — Telephone Encounter (Signed)
Called patient's number and sister in Law answered. I asked to speak with patient and she stated that patient was now in a SNF, [facility] in [place].  ?

## 2021-06-08 DIAGNOSIS — F32A Depression, unspecified: Secondary | ICD-10-CM | POA: Diagnosis not present

## 2021-06-08 DIAGNOSIS — R569 Unspecified convulsions: Secondary | ICD-10-CM | POA: Diagnosis not present

## 2021-06-08 DIAGNOSIS — I10 Essential (primary) hypertension: Secondary | ICD-10-CM | POA: Diagnosis not present

## 2021-06-08 DIAGNOSIS — R5381 Other malaise: Secondary | ICD-10-CM | POA: Diagnosis not present

## 2021-06-08 DIAGNOSIS — F0150 Vascular dementia without behavioral disturbance: Secondary | ICD-10-CM | POA: Diagnosis not present

## 2021-06-08 DIAGNOSIS — B20 Human immunodeficiency virus [HIV] disease: Secondary | ICD-10-CM | POA: Diagnosis not present

## 2021-06-08 DIAGNOSIS — E785 Hyperlipidemia, unspecified: Secondary | ICD-10-CM | POA: Diagnosis not present

## 2021-06-14 DIAGNOSIS — R609 Edema, unspecified: Secondary | ICD-10-CM | POA: Diagnosis not present

## 2021-06-15 DIAGNOSIS — F0150 Vascular dementia without behavioral disturbance: Secondary | ICD-10-CM | POA: Diagnosis not present

## 2021-06-15 DIAGNOSIS — Z87820 Personal history of traumatic brain injury: Secondary | ICD-10-CM | POA: Diagnosis not present

## 2021-06-15 DIAGNOSIS — N183 Chronic kidney disease, stage 3 unspecified: Secondary | ICD-10-CM | POA: Diagnosis not present

## 2021-06-15 DIAGNOSIS — B20 Human immunodeficiency virus [HIV] disease: Secondary | ICD-10-CM | POA: Diagnosis not present

## 2021-06-15 DIAGNOSIS — K219 Gastro-esophageal reflux disease without esophagitis: Secondary | ICD-10-CM | POA: Diagnosis not present

## 2021-06-15 DIAGNOSIS — E870 Hyperosmolality and hypernatremia: Secondary | ICD-10-CM | POA: Diagnosis not present

## 2021-06-15 DIAGNOSIS — I10 Essential (primary) hypertension: Secondary | ICD-10-CM | POA: Diagnosis not present

## 2021-06-15 DIAGNOSIS — R569 Unspecified convulsions: Secondary | ICD-10-CM | POA: Diagnosis not present

## 2021-06-21 DIAGNOSIS — R5381 Other malaise: Secondary | ICD-10-CM | POA: Diagnosis not present

## 2021-06-21 DIAGNOSIS — N183 Chronic kidney disease, stage 3 unspecified: Secondary | ICD-10-CM | POA: Diagnosis not present

## 2021-06-21 DIAGNOSIS — E785 Hyperlipidemia, unspecified: Secondary | ICD-10-CM | POA: Diagnosis not present

## 2021-06-21 DIAGNOSIS — I10 Essential (primary) hypertension: Secondary | ICD-10-CM | POA: Diagnosis not present

## 2021-06-21 DIAGNOSIS — K219 Gastro-esophageal reflux disease without esophagitis: Secondary | ICD-10-CM | POA: Diagnosis not present

## 2021-06-21 DIAGNOSIS — F32A Depression, unspecified: Secondary | ICD-10-CM | POA: Diagnosis not present

## 2021-06-21 DIAGNOSIS — B20 Human immunodeficiency virus [HIV] disease: Secondary | ICD-10-CM | POA: Diagnosis not present

## 2021-06-21 DIAGNOSIS — R569 Unspecified convulsions: Secondary | ICD-10-CM | POA: Diagnosis not present

## 2021-06-22 DIAGNOSIS — I10 Essential (primary) hypertension: Secondary | ICD-10-CM | POA: Diagnosis not present

## 2021-06-22 DIAGNOSIS — F0150 Vascular dementia without behavioral disturbance: Secondary | ICD-10-CM | POA: Diagnosis not present

## 2021-06-22 DIAGNOSIS — B20 Human immunodeficiency virus [HIV] disease: Secondary | ICD-10-CM | POA: Diagnosis not present

## 2021-06-22 DIAGNOSIS — R569 Unspecified convulsions: Secondary | ICD-10-CM | POA: Diagnosis not present

## 2021-06-22 DIAGNOSIS — N183 Chronic kidney disease, stage 3 unspecified: Secondary | ICD-10-CM | POA: Diagnosis not present

## 2021-06-29 DIAGNOSIS — F32A Depression, unspecified: Secondary | ICD-10-CM | POA: Diagnosis not present

## 2021-06-29 DIAGNOSIS — I10 Essential (primary) hypertension: Secondary | ICD-10-CM | POA: Diagnosis not present

## 2021-06-29 DIAGNOSIS — B20 Human immunodeficiency virus [HIV] disease: Secondary | ICD-10-CM | POA: Diagnosis not present

## 2021-06-29 DIAGNOSIS — R569 Unspecified convulsions: Secondary | ICD-10-CM | POA: Diagnosis not present

## 2021-06-29 DIAGNOSIS — F0150 Vascular dementia without behavioral disturbance: Secondary | ICD-10-CM | POA: Diagnosis not present

## 2021-07-12 ENCOUNTER — Telehealth: Payer: Self-pay

## 2021-07-12 NOTE — Telephone Encounter (Signed)
Patient's sister- in -law called office today stating he needs refills. Has not been seen in clinic since 2021. Is currently at [facility] living [facility] rehab facility in [place].  ?Spoke with Victorino Dike at [facility] to schedule appt. Is scheduled to do labs and follow up same day.  ?Juanita Laster, RMA ? ?

## 2021-07-15 DIAGNOSIS — B20 Human immunodeficiency virus [HIV] disease: Secondary | ICD-10-CM | POA: Diagnosis not present

## 2021-07-15 DIAGNOSIS — Z87820 Personal history of traumatic brain injury: Secondary | ICD-10-CM | POA: Diagnosis not present

## 2021-07-15 DIAGNOSIS — N183 Chronic kidney disease, stage 3 unspecified: Secondary | ICD-10-CM | POA: Diagnosis not present

## 2021-07-15 DIAGNOSIS — R569 Unspecified convulsions: Secondary | ICD-10-CM | POA: Diagnosis not present

## 2021-07-15 DIAGNOSIS — D649 Anemia, unspecified: Secondary | ICD-10-CM | POA: Diagnosis not present

## 2021-07-15 DIAGNOSIS — E785 Hyperlipidemia, unspecified: Secondary | ICD-10-CM | POA: Diagnosis not present

## 2021-07-15 DIAGNOSIS — I10 Essential (primary) hypertension: Secondary | ICD-10-CM | POA: Diagnosis not present

## 2021-07-15 DIAGNOSIS — F0150 Vascular dementia without behavioral disturbance: Secondary | ICD-10-CM | POA: Diagnosis not present

## 2021-07-19 ENCOUNTER — Ambulatory Visit: Payer: Self-pay | Admitting: Internal Medicine

## 2021-07-20 DIAGNOSIS — I10 Essential (primary) hypertension: Secondary | ICD-10-CM | POA: Diagnosis not present

## 2021-07-20 DIAGNOSIS — R569 Unspecified convulsions: Secondary | ICD-10-CM | POA: Diagnosis not present

## 2021-07-20 DIAGNOSIS — Z87820 Personal history of traumatic brain injury: Secondary | ICD-10-CM | POA: Diagnosis not present

## 2021-07-20 DIAGNOSIS — B20 Human immunodeficiency virus [HIV] disease: Secondary | ICD-10-CM | POA: Diagnosis not present

## 2021-07-20 DIAGNOSIS — E871 Hypo-osmolality and hyponatremia: Secondary | ICD-10-CM | POA: Diagnosis not present

## 2021-07-21 DIAGNOSIS — F32A Depression, unspecified: Secondary | ICD-10-CM | POA: Diagnosis not present

## 2021-07-21 DIAGNOSIS — I129 Hypertensive chronic kidney disease with stage 1 through stage 4 chronic kidney disease, or unspecified chronic kidney disease: Secondary | ICD-10-CM | POA: Diagnosis not present

## 2021-07-27 ENCOUNTER — Other Ambulatory Visit: Payer: Self-pay

## 2021-07-27 ENCOUNTER — Encounter: Payer: Self-pay | Admitting: Internal Medicine

## 2021-07-27 ENCOUNTER — Other Ambulatory Visit (HOSPITAL_COMMUNITY)
Admission: RE | Admit: 2021-07-27 | Discharge: 2021-07-27 | Disposition: A | Discharge status: Home / Self Care | Payer: Medicare (Managed Care) | Source: Ambulatory Visit | Attending: Internal Medicine | Admitting: Internal Medicine

## 2021-07-27 ENCOUNTER — Ambulatory Visit (INDEPENDENT_AMBULATORY_CARE_PROVIDER_SITE_OTHER): Payer: Self-pay | Admitting: Internal Medicine

## 2021-07-27 VITALS — BP 129/85 | HR 88 | Temp 98.5°F | Wt 152.0 lb

## 2021-07-27 DIAGNOSIS — N183 Chronic kidney disease, stage 3 unspecified: Secondary | ICD-10-CM | POA: Diagnosis not present

## 2021-07-27 DIAGNOSIS — B351 Tinea unguium: Secondary | ICD-10-CM

## 2021-07-27 DIAGNOSIS — Z79899 Other long term (current) drug therapy: Secondary | ICD-10-CM | POA: Diagnosis not present

## 2021-07-27 DIAGNOSIS — Z23 Encounter for immunization: Secondary | ICD-10-CM

## 2021-07-27 DIAGNOSIS — B20 Human immunodeficiency virus [HIV] disease: Secondary | ICD-10-CM

## 2021-07-27 MED ORDER — PITAVASTATIN CALCIUM 4 MG PO TABS: 1.0000 | ORAL_TABLET | Freq: Every day | ORAL | 11 refills | Status: AC

## 2021-07-27 MED ORDER — BIKTARVY 50-200-25 MG PO TABS: 1.0000 | ORAL_TABLET | Freq: Every day | ORAL | 11 refills | Status: AC

## 2021-07-27 NOTE — Progress Notes (Signed)
? ?RFV: follow up for hiv disease, last seen in dec 2021 ? ?Patient ID: Isaac Miller, male   DOB: [birth year], 56 y.o.   MRN: [id number] ? ?HPI ?[name] is a 56yo M with well controlled hiv disease, HLD, and cognitive impairment and CKD 3. Previously on biktarvy. Who was last seen 16 months ago. He is Is currently at [facility] living and rehab facility in [place]. Since we last saw him, he appears that he has lost weight. He usually wears glasses, for which he responded someone broke them. He has poor vision without his glasses. He has good appetite ? ?Poor dentition but no pain at any of the teeth or gum line ? ?He states his feet occasionally hurt -- he has long toe nails, hyperkeratotic with onychomycosis ? ?Outpatient Encounter Medications as of 07/27/2021  ?Medication Sig  ? atorvastatin (LIPITOR) 40 MG tablet TAKE 1 TABLET(40 MG) BY MOUTH DAILY  ? BIKTARVY 50-200-25 MG TABS tablet TAKE 1 TABLET BY MOUTH DAILY. STOP GENVOYA  ? BIKTARVY 50-200-25 MG TABS tablet TAKE 1 TABLET BY MOUTH DAILY  ? buPROPion (WELLBUTRIN SR) 150 MG 12 hr tablet TAKE 1 TABLET(150 MG) BY MOUTH TWICE DAILY  ? ferrous gluconate (FERGON) 324 MG tablet Take 324 mg by mouth 2 (two) times daily with a meal.  ? levETIRAcetam (KEPPRA) 750 MG tablet Take 750 mg by mouth 2 (two) times daily.  ? PEG-KCl-NaCl-NaSulf-Na Asc-C (PLENVU) 140 g SOLR Take 1 kit by mouth as directed. Use coupon: BIN: [id number] PNC: CNRX Group: [id number] ID: [id number]  ? polyethylene glycol powder (GLYCOLAX/MIRALAX) 17 GM/SCOOP powder Take 1 Container by mouth daily as needed.  ? sennosides-docusate sodium (SENOKOT-S) 8.6-50 MG tablet Take 1 tablet by mouth daily.  ? sertraline (ZOLOFT) 25 MG tablet Take 25 mg by mouth daily.  ? ?No facility-administered encounter medications on file as of 07/27/2021.  ?  ? ?Patient Active Problem List  ? Diagnosis Date Noted  ? Essential hypertension with CKD   ? CKD (chronic kidney disease) stage 3, GFR 30-59 ml/min (HCC)  05/08/2020  ? Hemoptysis 05/07/2020  ? Encephalomalacia 07/30/2019  ? Mild cognitive impairment 07/30/2019  ? TBI (traumatic brain injury) ([place]) 07/30/2019  ? GERD (gastroesophageal reflux disease) 06/25/2014  ? Hyperlipidemia with target LDL less than 100 07/23/2012  ? Hyperglycemia 07/23/2012  ? Medication monitoring encounter 10/08/2010  ? FRACTURE NOS, CLOSED 10/24/2006  ? History of traumatic head injury 10/24/2006  ? Human immunodeficiency virus (HIV) disease ([place]) 09/18/2006  ? ? ? ?Health Maintenance Due  ?Topic Date Due  ? Zoster Vaccines- Shingrix (1 of 2) Never done  ? COLONOSCOPY (Pts 45-36yrs Insurance coverage will need to be confirmed)  Never done  ? COVID-19 Vaccine (2 - Pfizer risk series) 05/29/2020  ?  ? ?Review of Systems ?Review of Systems  ?Constitutional: Negative for fever, chills, diaphoresis, activity change, appetite change, fatigue and unexpected weight change.  ?HENT: Negative for congestion, sore throat, rhinorrhea, sneezing, trouble swallowing and sinus pressure.  ?Eyes: Negative for photophobia and visual disturbance.  ?Respiratory: Negative for cough, chest tightness, shortness of breath, wheezing and stridor.  ?Cardiovascular: Negative for chest pain, palpitations and leg swelling.  ?Gastrointestinal: Negative for nausea, vomiting, abdominal pain, diarrhea, constipation, blood in stool, abdominal distention and anal bleeding.  ?Genitourinary: Negative for dysuria, hematuria, flank pain and difficulty urinating.  ?Musculoskeletal: Negative for myalgias, back pain, joint swelling, arthralgias and gait problem.  ?Skin: Negative for color change, pallor, rash and wound.  ?Neurological: Negative for  dizziness, tremors, weakness and light-headedness.  ?Hematological: Negative for adenopathy. Does not bruise/bleed easily.  ?Psychiatric/Behavioral: Negative for behavioral problems, confusion, sleep disturbance, dysphoric mood, decreased concentration and agitation.  ? ?Physical Exam  ? ?Wt  152 lb (68.9 kg)   BMI 26.09 kg/m?   ?Physical Exam  ?Constitutional: He is oriented to person, place, and time. He appears well-developed and under-nourished. No distress.  ?HENT: bitemporal wasting ?Mouth/Throat: Oropharynx is clear and moist. No oropharyngeal exudate. Poor dentition ?Cardiovascular: Normal rate, regular rhythm and normal heart sounds. Exam reveals no gallop and no friction rub.  ?No murmur heard.  ?Pulmonary/Chest: Effort normal and breath sounds normal. No respiratory distress. He has no wheezes.  ?Abdominal: Soft. Bowel sounds are normal. He exhibits no distension. There is no tenderness.  ?Lymphadenopathy:  ?He has no cervical adenopathy.  ?Neurological: He is alert and oriented to person, place, and time.  ?Skin: Skin is warm and dry. No rash noted. No erythema. Long nails, hyperkeratotic, onychomycosis ?Psychiatric: He has a normal mood and affect. His behavior is normal. More boisterious than on other visitis ? ?Lab Results  ?Component Value Date  ? CD4TCELL 22 (L) 10/31/2019  ? ?Lab Results  ?Component Value Date  ? CD4TABS 295 (L) 10/31/2019  ? CD4TABS 570 05/15/2018  ? CD4TABS 430 02/12/2018  ? ?Lab Results  ?Component Value Date  ? HIV1RNAQUANT 38 (H) 04/02/2020  ? ?Lab Results  ?Component Value Date  ? HEPBSAB POS (A) 12/17/2013  ? ?Lab Results  ?Component Value Date  ? LABRPR NON-REACTIVE 10/31/2019  ? ? ?CBC ?Lab Results  ?Component Value Date  ? WBC 5.6 10/31/2019  ? RBC 5.10 10/31/2019  ? HGB 14.3 10/31/2019  ? HCT 43.9 10/31/2019  ? PLT 253 10/31/2019  ? MCV 86.1 10/31/2019  ? MCH 28.0 10/31/2019  ? MCHC 32.6 10/31/2019  ? RDW 15.5 (H) 10/31/2019  ? LYMPHSABS 1,294 10/31/2019  ? MONOABS 360 11/03/2016  ? EOSABS 67 10/31/2019  ? ? ?BMET ?Lab Results  ?Component Value Date  ? NA 146 10/31/2019  ? K 4.0 10/31/2019  ? CL 108 10/31/2019  ? CO2 26 10/31/2019  ? GLUCOSE 95 10/31/2019  ? BUN 32 (H) 10/31/2019  ? CREATININE 1.98 (H) 10/31/2019  ? CALCIUM 10.0 10/31/2019  ? GFRNONAA 37 (L)  10/31/2019  ? GFRAA 43 (L) 10/31/2019  ? ? ? ? ?Assessment and Plan ? ?HIV disease= will check HIV VL and CD 4 count, RPR, CMP, and CBC ? ?Ckd 3= also proteinuria due to ckd 3 and cr to see if need to change to different regimen ? ?Hyperlipidemia = will change artovastatin to pitastatin $RemoveBefor'4mg'YeXNJvGXqKvG$  as this has better outcomes for cardioprotective. Sent in rx to pharmacy affiliated with  facility ? ?Visual impairment = needs new eye exam and to have new pair eye glasses ? ?Onychomycosis-nails = please have them trimmed /dremmel-shaved down to minimize risk of cellulitis/parynochia ? ?Health maintenance = gave a dose of prevnar 20 today ? ?Come back to see [place] in 3-4 months ? ? ? ?

## 2021-07-29 ENCOUNTER — Telehealth: Payer: Self-pay

## 2021-07-29 LAB — COMPLETE METABOLIC PANEL WITH GFR
AG Ratio: 1.4 (calc) (ref 1.0–2.5)
ALT: 27 U/L (ref 9–46)
AST: 20 U/L (ref 10–35)
Albumin: 4.6 g/dL (ref 3.6–5.1)
Alkaline phosphatase (APISO): 98 U/L (ref 35–144)
BUN/Creatinine Ratio: 13 (calc) (ref 6–22)
BUN: 21 mg/dL (ref 7–25)
CO2: 24 mmol/L (ref 20–32)
Calcium: 9.9 mg/dL (ref 8.6–10.3)
Chloride: 109 mmol/L (ref 98–110)
Creat: 1.64 mg/dL — ABNORMAL HIGH (ref 0.70–1.30)
Globulin: 3.3 g/dL (calc) (ref 1.9–3.7)
Glucose, Bld: 95 mg/dL (ref 65–99)
Potassium: 4.5 mmol/L (ref 3.5–5.3)
Sodium: 144 mmol/L (ref 135–146)
Total Bilirubin: 0.2 mg/dL (ref 0.2–1.2)
Total Protein: 7.9 g/dL (ref 6.1–8.1)
eGFR: 49 mL/min/{1.73_m2} — ABNORMAL LOW (ref >=60)

## 2021-07-29 LAB — CBC WITH DIFFERENTIAL/PLATELET
Absolute Monocytes: 324 cells/uL (ref 200–950)
Basophils Absolute: 50 cells/uL (ref 0–200)
Basophils Relative: 1.1 %
Eosinophils Absolute: 122 cells/uL (ref 15–500)
Eosinophils Relative: 2.7 %
HCT: 47.8 % (ref 38.5–50.0)
Hemoglobin: 14.8 g/dL (ref 13.2–17.1)
Lymphs Abs: 1233 cells/uL (ref 850–3900)
MCH: 26.7 pg — ABNORMAL LOW (ref 27.0–33.0)
MCHC: 31 g/dL — ABNORMAL LOW (ref 32.0–36.0)
MCV: 86.3 fL (ref 80.0–100.0)
MPV: 12.4 fL (ref 7.5–12.5)
Monocytes Relative: 7.2 %
Neutro Abs: 2772 cells/uL (ref 1500–7800)
Neutrophils Relative %: 61.6 %
Platelets: 221 10*3/uL (ref 140–400)
RBC: 5.54 10*6/uL (ref 4.20–5.80)
RDW: 14.8 % (ref 11.0–15.0)
Total Lymphocyte: 27.4 %
WBC: 4.5 10*3/uL (ref 3.8–10.8)

## 2021-07-29 LAB — URINALYSIS
Bilirubin Urine: NEGATIVE
Glucose, UA: NEGATIVE
Hgb urine dipstick: NEGATIVE
Ketones, ur: NEGATIVE
Leukocytes,Ua: NEGATIVE
Nitrite: NEGATIVE
Protein, ur: NEGATIVE
Specific Gravity, Urine: 1.022 (ref 1.001–1.035)
pH: 5.5 (ref 5.0–8.0)

## 2021-07-29 LAB — URINE CYTOLOGY ANCILLARY ONLY
Chlamydia: NEGATIVE
Comment: NEGATIVE
Comment: NORMAL
Neisseria Gonorrhea: NEGATIVE

## 2021-07-29 LAB — MICROALBUMIN / CREATININE URINE RATIO
Creatinine, Urine: 172 mg/dL (ref 20–320)
Microalb Creat Ratio: 5 mcg/mg creat (ref <30)
Microalb, Ur: 0.8 mg/dL

## 2021-07-29 LAB — LIPID PANEL
Cholesterol: 158 mg/dL (ref <200)
HDL: 75 mg/dL (ref >=40)
LDL Cholesterol (Calc): 65 mg/dL (calc)
Non-HDL Cholesterol (Calc): 83 mg/dL (calc) (ref <130)
Total CHOL/HDL Ratio: 2.1 (calc) (ref <5.0)
Triglycerides: 96 mg/dL (ref <150)

## 2021-07-29 LAB — HIV-1 RNA QUANT-NO REFLEX-BLD
HIV 1 RNA Quant: <20 copies/mL — AB
HIV-1 RNA Quant, Log: <1.3 Log copies/mL — AB

## 2021-07-29 LAB — RPR: RPR Ser Ql: NONREACTIVE

## 2021-07-29 NOTE — Telephone Encounter (Signed)
PA for pitavastatin submitted via covermymeds. Awaiting response.  ? ?Key: [id number] ? ?Sandie Ano, RN ? ?

## 2021-07-30 NOTE — Telephone Encounter (Signed)
Received fax from [place] stating PA was denied. Provider can submit appeal regarding claim.  ?Will update provider.  ?

## 2021-07-30 NOTE — Telephone Encounter (Signed)
Attempted to resubmit PA with corrections via covermymeds. Received message: "Drug is covered by current benefit plan. No further PA activity needed." ? ?Sandie Ano, RN ? ?

## 2021-08-04 ENCOUNTER — Ambulatory Visit (INDEPENDENT_AMBULATORY_CARE_PROVIDER_SITE_OTHER): Payer: Medicare (Managed Care) | Admitting: Podiatry

## 2021-08-04 ENCOUNTER — Encounter: Payer: Self-pay | Admitting: Podiatry

## 2021-08-04 DIAGNOSIS — N183 Chronic kidney disease, stage 3 unspecified: Secondary | ICD-10-CM | POA: Diagnosis not present

## 2021-08-04 DIAGNOSIS — B351 Tinea unguium: Secondary | ICD-10-CM | POA: Diagnosis not present

## 2021-08-04 DIAGNOSIS — M79674 Pain in right toe(s): Secondary | ICD-10-CM | POA: Diagnosis not present

## 2021-08-04 DIAGNOSIS — M79675 Pain in left toe(s): Secondary | ICD-10-CM

## 2021-08-04 NOTE — Progress Notes (Signed)
This patient returns to my office for at risk foot care.  This patient requires this care by a professional since this patient will be at risk due to having CKD.    This patient is unable to cut nails himself since the patient cannot reach his nails.These nails are painful walking and wearing shoes. Patient presents to the office with male caregiver. This patient presents for at risk foot care today.  General Appearance  Alert, conversant and in no acute stress.  Vascular  Dorsalis pedis and posterior tibial  pulses are palpable  bilaterally.  Capillary return is within normal limits  bilaterally. Temperature is within normal limits  bilaterally.  Neurologic  Senn-Weinstein monofilament wire test within normal limits  bilaterally. Muscle power within normal limits bilaterally.  Nails Thick disfigured discolored nails with subungual debris  from hallux to fifth toes bilaterally. No evidence of bacterial infection or drainage bilaterally.  Orthopedic  No limitations of motion  feet .  No crepitus or effusions noted.  No bony pathology or digital deformities noted.  Skin  normotropic skin with no porokeratosis noted bilaterally.  No signs of infections or ulcers noted.     Onychomycosis  Pain in right toes  Pain in left toes  Consent was obtained for treatment procedures.   Mechanical debridement of nails 1-5  bilaterally performed with a nail nipper.  Filed with dremel without incident.    Return office visit    3 months                  Told patient to return for periodic foot care and evaluation due to potential at risk complications.   Dragan Tamburrino DPM   

## 2021-08-05 DIAGNOSIS — B20 Human immunodeficiency virus [HIV] disease: Secondary | ICD-10-CM | POA: Diagnosis not present

## 2021-08-05 DIAGNOSIS — N183 Chronic kidney disease, stage 3 unspecified: Secondary | ICD-10-CM | POA: Diagnosis not present

## 2021-08-05 DIAGNOSIS — R569 Unspecified convulsions: Secondary | ICD-10-CM | POA: Diagnosis not present

## 2021-08-05 DIAGNOSIS — I10 Essential (primary) hypertension: Secondary | ICD-10-CM | POA: Diagnosis not present

## 2021-08-05 DIAGNOSIS — E785 Hyperlipidemia, unspecified: Secondary | ICD-10-CM | POA: Diagnosis not present

## 2021-08-05 DIAGNOSIS — F32A Depression, unspecified: Secondary | ICD-10-CM | POA: Diagnosis not present

## 2021-08-10 DIAGNOSIS — F03B3 Unspecified dementia, moderate, with mood disturbance: Secondary | ICD-10-CM | POA: Diagnosis not present

## 2021-08-10 DIAGNOSIS — S062X9S Diffuse traumatic brain injury with loss of consciousness of unspecified duration, sequela: Secondary | ICD-10-CM | POA: Diagnosis not present

## 2021-08-10 DIAGNOSIS — F331 Major depressive disorder, recurrent, moderate: Secondary | ICD-10-CM | POA: Diagnosis not present

## 2021-08-18 DIAGNOSIS — R569 Unspecified convulsions: Secondary | ICD-10-CM | POA: Diagnosis not present

## 2021-08-18 DIAGNOSIS — I10 Essential (primary) hypertension: Secondary | ICD-10-CM | POA: Diagnosis not present

## 2021-08-18 DIAGNOSIS — B20 Human immunodeficiency virus [HIV] disease: Secondary | ICD-10-CM | POA: Diagnosis not present

## 2021-08-18 DIAGNOSIS — E785 Hyperlipidemia, unspecified: Secondary | ICD-10-CM | POA: Diagnosis not present

## 2021-08-28 ENCOUNTER — Other Ambulatory Visit: Payer: Self-pay | Admitting: Internal Medicine

## 2021-08-31 ENCOUNTER — Other Ambulatory Visit: Payer: Self-pay

## 2021-09-07 DIAGNOSIS — H04123 Dry eye syndrome of bilateral lacrimal glands: Secondary | ICD-10-CM | POA: Diagnosis not present

## 2021-09-07 DIAGNOSIS — H2513 Age-related nuclear cataract, bilateral: Secondary | ICD-10-CM | POA: Diagnosis not present

## 2021-09-07 DIAGNOSIS — I10 Essential (primary) hypertension: Secondary | ICD-10-CM | POA: Diagnosis not present

## 2021-09-07 DIAGNOSIS — B20 Human immunodeficiency virus [HIV] disease: Secondary | ICD-10-CM | POA: Diagnosis not present

## 2021-09-15 DIAGNOSIS — R569 Unspecified convulsions: Secondary | ICD-10-CM | POA: Diagnosis not present

## 2021-09-15 DIAGNOSIS — K219 Gastro-esophageal reflux disease without esophagitis: Secondary | ICD-10-CM | POA: Diagnosis not present

## 2021-09-15 DIAGNOSIS — I10 Essential (primary) hypertension: Secondary | ICD-10-CM | POA: Diagnosis not present

## 2021-09-15 DIAGNOSIS — N183 Chronic kidney disease, stage 3 unspecified: Secondary | ICD-10-CM | POA: Diagnosis not present

## 2021-09-15 DIAGNOSIS — B20 Human immunodeficiency virus [HIV] disease: Secondary | ICD-10-CM | POA: Diagnosis not present

## 2021-09-28 DIAGNOSIS — F331 Major depressive disorder, recurrent, moderate: Secondary | ICD-10-CM | POA: Diagnosis not present

## 2021-09-28 DIAGNOSIS — S062X9S Diffuse traumatic brain injury with loss of consciousness of unspecified duration, sequela: Secondary | ICD-10-CM | POA: Diagnosis not present

## 2021-09-28 DIAGNOSIS — F03B3 Unspecified dementia, moderate, with mood disturbance: Secondary | ICD-10-CM | POA: Diagnosis not present

## 2021-10-12 DIAGNOSIS — F0150 Vascular dementia without behavioral disturbance: Secondary | ICD-10-CM | POA: Diagnosis not present

## 2021-10-12 DIAGNOSIS — B20 Human immunodeficiency virus [HIV] disease: Secondary | ICD-10-CM | POA: Diagnosis not present

## 2021-10-12 DIAGNOSIS — N183 Chronic kidney disease, stage 3 unspecified: Secondary | ICD-10-CM | POA: Diagnosis not present

## 2021-10-12 DIAGNOSIS — I10 Essential (primary) hypertension: Secondary | ICD-10-CM | POA: Diagnosis not present

## 2021-10-12 DIAGNOSIS — R569 Unspecified convulsions: Secondary | ICD-10-CM | POA: Diagnosis not present

## 2021-10-12 DIAGNOSIS — K219 Gastro-esophageal reflux disease without esophagitis: Secondary | ICD-10-CM | POA: Diagnosis not present

## 2021-10-12 DIAGNOSIS — E785 Hyperlipidemia, unspecified: Secondary | ICD-10-CM | POA: Diagnosis not present

## 2021-10-12 DIAGNOSIS — Z87820 Personal history of traumatic brain injury: Secondary | ICD-10-CM | POA: Diagnosis not present

## 2021-10-26 ENCOUNTER — Ambulatory Visit: Payer: Medicare (Managed Care) | Admitting: Internal Medicine

## 2021-10-26 DIAGNOSIS — F03B3 Unspecified dementia, moderate, with mood disturbance: Secondary | ICD-10-CM | POA: Diagnosis not present

## 2021-10-26 DIAGNOSIS — S062X9S Diffuse traumatic brain injury with loss of consciousness of unspecified duration, sequela: Secondary | ICD-10-CM | POA: Diagnosis not present

## 2021-10-26 DIAGNOSIS — F330 Major depressive disorder, recurrent, mild: Secondary | ICD-10-CM | POA: Diagnosis not present

## 2021-10-28 ENCOUNTER — Encounter: Payer: Self-pay | Admitting: Family

## 2021-10-28 ENCOUNTER — Ambulatory Visit (INDEPENDENT_AMBULATORY_CARE_PROVIDER_SITE_OTHER): Payer: Medicare (Managed Care) | Admitting: Family

## 2021-10-28 VITALS — BP 128/70 | HR 97 | Temp 98.5°F | Resp 16 | Ht 64.0 in | Wt 168.0 lb

## 2021-10-28 DIAGNOSIS — Z1211 Encounter for screening for malignant neoplasm of colon: Secondary | ICD-10-CM

## 2021-10-28 DIAGNOSIS — N183 Chronic kidney disease, stage 3 unspecified: Secondary | ICD-10-CM

## 2021-10-28 DIAGNOSIS — N1831 Chronic kidney disease, stage 3a: Secondary | ICD-10-CM

## 2021-10-28 DIAGNOSIS — L602 Onychogryphosis: Secondary | ICD-10-CM

## 2021-10-28 DIAGNOSIS — Z23 Encounter for immunization: Secondary | ICD-10-CM | POA: Diagnosis not present

## 2021-10-28 DIAGNOSIS — H6123 Impacted cerumen, bilateral: Secondary | ICD-10-CM | POA: Diagnosis not present

## 2021-10-28 DIAGNOSIS — E785 Hyperlipidemia, unspecified: Secondary | ICD-10-CM | POA: Diagnosis not present

## 2021-10-28 DIAGNOSIS — F02818 Dementia in other diseases classified elsewhere, unspecified severity, with other behavioral disturbance: Secondary | ICD-10-CM | POA: Diagnosis not present

## 2021-10-28 DIAGNOSIS — K219 Gastro-esophageal reflux disease without esophagitis: Secondary | ICD-10-CM

## 2021-10-28 DIAGNOSIS — F339 Major depressive disorder, recurrent, unspecified: Secondary | ICD-10-CM | POA: Diagnosis not present

## 2021-10-28 DIAGNOSIS — I129 Hypertensive chronic kidney disease with stage 1 through stage 4 chronic kidney disease, or unspecified chronic kidney disease: Secondary | ICD-10-CM | POA: Diagnosis not present

## 2021-10-28 MED ORDER — SHINGRIX 50 MCG/0.5ML IM SUSR: 0.5000 mL | Freq: Once | INTRAMUSCULAR | 0 refills | Status: AC

## 2021-10-28 NOTE — Progress Notes (Signed)
Provider: Richarda Blade FNP-C   Kippy Gohman, Donalee Citrin, NP  Patient Care Team: Xayden Linsey, Donalee Citrin, NP as PCP - General (Family Medicine) Judyann Munson, MD as PCP - Infectious Diseases (Infectious Diseases)  Extended Emergency Contact Information Primary Emergency Contact: [facility] Address: [address] [place] [address] [place] of [place] Home Phone: [phone] Relation: Mother Secondary Emergency Contact: [facility] Mobile Phone: [phone] Relation: Brother  Code Status:  Full Code  Goals of care: Advanced Directive information    10/28/2021    2:04 PM  Advanced Directives  Does Patient Have a Medical Advance Directive? No  Would patient like information on creating a medical advance directive? No - Patient declined     Chief Complaint  Patient presents with   Medical Management of Chronic Issues    6 month follow up.   Health Maintenance    Discuss the need for Colonoscopy.   Immunizations    Discuss the need for Shingrix vaccine, and Covid vaccine.    HPI:  Pt is a 56 y.o. male seen today for 6 months for medical management of chronic diseases.Has a medical history of  Essential hypertension, hyperlipidemia, GERD, CKD stage III, seizures posttraumatic brain injury, major depression, dementia with behavioral disturbance HIV among other He denies any acute issues. Due for colonoscopy screening.  Agrees for referral to GI. Also due for Shingrix and COVID-vaccine.  Past Medical History:  Diagnosis Date   Abnormality of gait    Depressive disorder, not elsewhere classified    Dysphagia, oral phase    Encounter for long-term (current) use of other medications    H/O traumatic subdural hematoma 2005   History of subarachnoid hemorrhage 2005   Human immunodeficiency virus (HIV) disease (HCC)    Hyperosmolality and/or hypernatremia    Impacted cerumen    Injury to unspecified blood vessel of head and neck    Insomnia, unspecified     Moderate intellectual disabilities    Onychia and paronychia of toe    Other abnormal blood chemistry    Other and unspecified hyperlipidemia    Other malaise and fatigue    Pain in limb    Reflux esophagitis    Unspecified disorder of liver    Unspecified essential hypertension    Unspecified nonpsychotic mental disorder    Unspecified psychosis    Ventilator dependence (HCC) 2005   history of    Past Surgical History:  Procedure Laterality Date   CRANIOTOMY  2005   after jumping from bridge   PEG PLACEMENT  2005   Teeth pulled  2013   TRACHEAL SURGERY  2005    No Known Allergies  Allergies as of 10/28/2021   No Known Allergies      Medication List        Accurate as of October 28, 2021  2:17 PM. If you have any questions, ask your nurse or doctor.          STOP taking these medications    ferrous gluconate 324 MG tablet Commonly known as: FERGON Stopped by: Caesar Bookman, NP   Plenvu 140 g Solr Generic drug: PEG-KCl-NaCl-NaSulf-Na Asc-C Stopped by: Caesar Bookman, NP       TAKE these medications    Biktarvy 50-200-25 MG Tabs tablet Generic drug: bictegravir-emtricitabine-tenofovir AF Take 1 tablet by mouth daily.   buPROPion 150 MG 12 hr tablet Commonly known as: WELLBUTRIN SR TAKE 1 TABLET(150 MG) BY MOUTH TWICE DAILY  calcium carbonate 500 MG chewable tablet Commonly known as: TUMS - dosed in mg elemental calcium Chew 1 tablet by mouth 2 (two) times daily.   ferrous sulfate 300 (60 Fe) MG/5ML syrup Take 300 mg by mouth 3 (three) times daily with meals.   hydrALAZINE 25 MG tablet Commonly known as: APRESOLINE Take 12.5 mg by mouth every 8 (eight) hours as needed.   levETIRAcetam 750 MG tablet Commonly known as: KEPPRA Take 750 mg by mouth 2 (two) times daily.   pantoprazole 40 MG tablet Commonly known as: PROTONIX Take 40 mg by mouth daily.   Pitavastatin Calcium 4 MG Tabs Take 1 tablet (4 mg total) by mouth daily. Take instead of  artovastatin   polyethylene glycol powder 17 GM/SCOOP powder Commonly known as: GLYCOLAX/MIRALAX Take 1 Container by mouth daily as needed.   sennosides-docusate sodium 8.6-50 MG tablet Commonly known as: SENOKOT-S Take 1 tablet by mouth daily.   sertraline 25 MG tablet Commonly known as: ZOLOFT Take 25 mg by mouth daily.   Shingrix injection Generic drug: Zoster Vaccine Adjuvanted Inject 0.5 mLs into the muscle once. Repeat x 1 dose in 3-6 months   sodium bicarbonate 650 MG tablet Take 650 mg by mouth 2 (two) times daily.        Review of Systems  Constitutional:  Negative for appetite change, chills, fatigue, fever and unexpected weight change.  HENT:  Negative for congestion, dental problem, ear discharge, ear pain, facial swelling, hearing loss, nosebleeds, postnasal drip, rhinorrhea, sinus pressure, sinus pain, sneezing, sore throat, tinnitus and trouble swallowing.   Eyes:  Negative for pain, discharge, redness, itching and visual disturbance.  Respiratory:  Negative for cough, chest tightness, shortness of breath and wheezing.   Cardiovascular:  Negative for chest pain, palpitations and leg swelling.  Gastrointestinal:  Negative for abdominal distention, abdominal pain, blood in stool, constipation, diarrhea, nausea and vomiting.  Endocrine: Negative for cold intolerance, heat intolerance, polydipsia, polyphagia and polyuria.  Genitourinary:  Negative for difficulty urinating, dysuria, flank pain, frequency and urgency.  Musculoskeletal:  Negative for arthralgias, back pain, gait problem, joint swelling, myalgias, neck pain and neck stiffness.  Skin:  Negative for color change, pallor, rash and wound.  Neurological:  Negative for dizziness, syncope, speech difficulty, weakness, light-headedness, numbness and headaches.  Hematological:  Does not bruise/bleed easily.  Psychiatric/Behavioral:  Negative for agitation, confusion, hallucinations, self-injury, sleep disturbance  and suicidal ideas. The patient is not nervous/anxious.        Memory loss    Immunization History  Administered Date(s) Administered   H1N1 07/03/2008   Hepatitis B 12/28/2006, 07/03/2008, 12/18/2008   Influenza Split 01/03/2011, 01/04/2012   Influenza Whole 02/03/2005, 12/28/2006, 01/29/2010   Influenza,inj,Quad PF,6+ Mos 01/29/2013, 12/17/2013, 02/11/2016, 02/12/2018, 02/20/2020, 04/29/2021   Influenza-Unspecified 12/04/2014, 12/03/2016   Meningococcal Mcv4o 03/15/2016   PFIZER Comirnaty(Gray Top)Covid-19 Tri-Sucrose Vaccine 05/08/2020   PNEUMOCOCCAL CONJUGATE-20 07/27/2021   Pneumococcal Conjugate-13 08/04/2014   Pneumococcal Polysaccharide-23 02/23/2005, 11/04/2010   Tdap 05/07/2020   Pertinent  Health Maintenance Due  Topic Date Due   COLONOSCOPY (Pts 45-82yrs Insurance coverage will need to be confirmed)  Never done   INFLUENZA VACCINE  11/02/2021      12/24/2019    8:30 AM 02/20/2020    8:24 AM 04/29/2021    3:49 PM 05/05/2021    4:48 PM 10/28/2021    2:04 PM  [place] in the past year? 1 0 0 0 0  Was there an injury with Fall? 1 0  0 0 0  Fall Risk Category Calculator 3 0 0 0 0  Fall Risk Category High Low Low Low Low  Patient Fall Risk Level High fall risk  Low fall risk Low fall risk Low fall risk  Patient at Risk for Falls Due to   No Fall Risks No Fall Risks No Fall Risks  Fall risk Follow up   Falls evaluation completed Falls evaluation completed Falls evaluation completed   Functional Status Survey:    Vitals:   10/28/21 1347  BP: 128/70  Pulse: 97  Resp: 16  Temp: 98.5 F (36.9 C)  SpO2: 98%  Weight: 168 lb (76.2 kg)  Height: $Remove'5\' 4"'XHQosQv$  (1.626 m)   Body mass index is 28.84 kg/m. Physical Exam Vitals reviewed.  Constitutional:      General: He is not in acute distress.    Appearance: Normal appearance. He is normal weight. He is not ill-appearing or diaphoretic.  HENT:     Head: Normocephalic.     Right Ear: Tympanic membrane, ear canal and  external ear normal. There is no impacted cerumen.     Left Ear: Tympanic membrane, ear canal and external ear normal. There is no impacted cerumen.     Nose: Nose normal. No congestion or rhinorrhea.     Mouth/Throat:     Mouth: Mucous membranes are moist.     Pharynx: Oropharynx is clear. No oropharyngeal exudate or posterior oropharyngeal erythema.  Eyes:     General: No scleral icterus.       Right eye: No discharge.        Left eye: No discharge.     Extraocular Movements: Extraocular movements intact.     Conjunctiva/sclera: Conjunctivae normal.     Pupils: Pupils are equal, round, and reactive to light.  Neck:     Vascular: No carotid bruit.  Cardiovascular:     Rate and Rhythm: Normal rate and regular rhythm.     Pulses: Normal pulses.     Heart sounds: Normal heart sounds. No murmur heard.    No friction rub. No gallop.  Pulmonary:     Effort: Pulmonary effort is normal. No respiratory distress.     Breath sounds: Normal breath sounds. No wheezing, rhonchi or rales.  Chest:     Chest wall: No tenderness.  Abdominal:     General: Bowel sounds are normal. There is no distension.     Palpations: Abdomen is soft. There is no mass.     Tenderness: There is no abdominal tenderness. There is no right CVA tenderness, left CVA tenderness, guarding or rebound.  Musculoskeletal:        General: No swelling or tenderness. Normal range of motion.     Cervical back: Normal range of motion. No rigidity or tenderness.     Right lower leg: No edema.     Left lower leg: No edema.  Feet:     Right foot:     Skin integrity: No erythema, warmth, callus or dry skin.     Toenail Condition: Right toenails are long.     Left foot:     Skin integrity: No erythema, warmth, callus or dry skin.     Toenail Condition: Left toenails are long.  Lymphadenopathy:     Cervical: No cervical adenopathy.  Skin:    General: Skin is warm and dry.     Coloration: Skin is not pale.     Findings: No  bruising, erythema, lesion or rash.  Neurological:  Mental Status: He is alert and oriented to person, place, and time.     Cranial Nerves: No cranial nerve deficit.     Sensory: No sensory deficit.     Motor: No weakness.     Coordination: Coordination normal.     Gait: Gait abnormal.  Psychiatric:        Mood and Affect: Mood normal.        Speech: Speech normal.        Behavior: Behavior normal.        Thought Content: Thought content normal.        Cognition and Memory: Memory is impaired.        Judgment: Judgment normal.     Labs reviewed: Recent Labs    07/27/21 1647  NA 144  K 4.5  CL 109  CO2 24  GLUCOSE 95  BUN 21  CREATININE 1.64*  CALCIUM 9.9   Recent Labs    07/27/21 1647  AST 20  ALT 27  BILITOT 0.2  PROT 7.9   Recent Labs    07/27/21 1647  WBC 4.5  NEUTROABS 2,772  HGB 14.8  HCT 47.8  MCV 86.3  PLT 221   Lab Results  Component Value Date   TSH 0.957 04/18/2014   Lab Results  Component Value Date   HGBA1C 5.5 09/05/2017   Lab Results  Component Value Date   CHOL 158 07/27/2021   HDL 75 07/27/2021   LDLCALC 65 07/27/2021   TRIG 96 07/27/2021   CHOLHDL 2.1 07/27/2021    Significant Diagnostic Results in last 30 days:  No results found.  Assessment/Plan Problem List Items Addressed This Visit       Cardiovascular and Mediastinum   Benign hypertension with chronic kidney disease, stage III ([place]) - Primary    - Blood pressure well controlled -Continue on dietary modification and exercise      Relevant Medications   hydrALAZINE (APRESOLINE) 25 MG tablet   Other Relevant Orders   TSH   COMPLETE METABOLIC PANEL WITH GFR   CBC with Differential/Platelet     Digestive   GERD (gastroesophageal reflux disease)    Symptoms stable Continue on Protonix      Relevant Medications   calcium carbonate (TUMS - DOSED IN MG ELEMENTAL CALCIUM) 500 MG chewable tablet   pantoprazole (PROTONIX) 40 MG tablet   sodium bicarbonate 650  MG tablet   Other Relevant Orders   CBC with Differential/Platelet     Nervous and Auditory   Dementia associated with other underlying disease with behavioral disturbance ([place])    No new behavioral issues reported Continue with supportive care Continue on Wellbutrin      Bilateral impacted cerumen    Bilateral ear lavage done tolerated procedure well.  No signs of infection.  No instrument used        Musculoskeletal and Integument   Overgrown toenails    Bilateral feet overgrown toenails yellow, with strong smell. Urgent referral to  podiatrist to trim toenails      Relevant Orders   Ambulatory referral to Podiatry     Genitourinary   CKD (chronic kidney disease) stage 3, GFR 30-59 ml/min (HCC)    Latest creatinine at baseline Will continue to avoid Nephrotoxins and dose all other medication for renal clearance  Encourage hydration        Other   Need for shingles vaccine    Advised to get shingrix vaccine at the pharmacy.No script needed  Hyperlipidemia with target LDL less than 100    No latest LDL for review -Continue on statin Continue dietary modification and exercise      Relevant Medications   hydrALAZINE (APRESOLINE) 25 MG tablet   Other Relevant Orders   Lipid panel   Depression, recurrent (HCC)    Mood stable Continue on Wellbutrin and sertraline       Relevant Orders   TSH   Other Visit Diagnoses     Colon cancer screening       Relevant Orders   Ambulatory referral to Gastroenterology         Family/ staff Communication: Reviewed plan of care with patient verbalized understanding  Labs/tests ordered:  - CBC with Differential/Platelet - CMP with eGFR([facility]) - TSH - Lipid panel  Next Appointment : Return in about 6 months (around 04/30/2022) for medical mangement of chronic issues., Fasting labs in 6 months prior to visit.   Sandrea Hughs, NP

## 2021-10-31 DIAGNOSIS — N183 Chronic kidney disease, stage 3 unspecified: Secondary | ICD-10-CM | POA: Insufficient documentation

## 2021-10-31 DIAGNOSIS — F02818 Dementia in other diseases classified elsewhere, unspecified severity, with other behavioral disturbance: Secondary | ICD-10-CM | POA: Insufficient documentation

## 2021-10-31 DIAGNOSIS — Z23 Encounter for immunization: Secondary | ICD-10-CM | POA: Insufficient documentation

## 2021-10-31 DIAGNOSIS — F339 Major depressive disorder, recurrent, unspecified: Secondary | ICD-10-CM | POA: Insufficient documentation

## 2021-10-31 DIAGNOSIS — L602 Onychogryphosis: Secondary | ICD-10-CM | POA: Insufficient documentation

## 2021-10-31 DIAGNOSIS — H6123 Impacted cerumen, bilateral: Secondary | ICD-10-CM | POA: Insufficient documentation

## 2021-10-31 NOTE — Assessment & Plan Note (Signed)
Symptoms stable Continue on Protonix

## 2021-10-31 NOTE — Assessment & Plan Note (Signed)
Bilateral ear lavage done tolerated procedure well.  No signs of infection.  No instrument used

## 2021-10-31 NOTE — Assessment & Plan Note (Signed)
Bilateral feet overgrown toenails yellow, with strong smell. Urgent referral to  podiatrist to trim toenails

## 2021-10-31 NOTE — Assessment & Plan Note (Signed)
Latest creatinine at baseline Will continue to avoid Nephrotoxins and dose all other medication for renal clearance  Encourage hydration

## 2021-10-31 NOTE — Assessment & Plan Note (Signed)
No new behavioral issues reported Continue with supportive care Continue on Wellbutrin

## 2021-10-31 NOTE — Assessment & Plan Note (Signed)
Advised to get shingrix vaccine at the pharmacy.No script needed

## 2021-10-31 NOTE — Assessment & Plan Note (Signed)
-   Blood pressure well controlled -Continue on dietary modification and exercise

## 2021-10-31 NOTE — Assessment & Plan Note (Signed)
Continues to reside at an assisted living facility Continue to assess with ADLs and redirection.

## 2021-10-31 NOTE — Assessment & Plan Note (Signed)
Mood stable Continue on Wellbutrin and sertraline

## 2021-10-31 NOTE — Assessment & Plan Note (Signed)
No latest LDL for review -Continue on statin Continue dietary modification and exercise

## 2021-11-02 DIAGNOSIS — H9071 Mixed conductive and sensorineural hearing loss, unilateral, right ear, with unrestricted hearing on the contralateral side: Secondary | ICD-10-CM | POA: Diagnosis not present

## 2021-11-02 DIAGNOSIS — H9041 Sensorineural hearing loss, unilateral, right ear, with unrestricted hearing on the contralateral side: Secondary | ICD-10-CM | POA: Diagnosis not present

## 2021-11-05 ENCOUNTER — Ambulatory Visit (INDEPENDENT_AMBULATORY_CARE_PROVIDER_SITE_OTHER): Payer: Medicare (Managed Care) | Admitting: Podiatry

## 2021-11-05 ENCOUNTER — Encounter: Payer: Self-pay | Admitting: Podiatry

## 2021-11-05 DIAGNOSIS — M79674 Pain in right toe(s): Secondary | ICD-10-CM | POA: Diagnosis not present

## 2021-11-05 DIAGNOSIS — B351 Tinea unguium: Secondary | ICD-10-CM | POA: Diagnosis not present

## 2021-11-05 DIAGNOSIS — N183 Chronic kidney disease, stage 3 unspecified: Secondary | ICD-10-CM

## 2021-11-05 DIAGNOSIS — M79675 Pain in left toe(s): Secondary | ICD-10-CM

## 2021-11-05 NOTE — Progress Notes (Signed)
This patient returns to my office for at risk foot care.  This patient requires this care by a professional since this patient will be at risk due to having CKD.    This patient is unable to cut nails himself since the patient cannot reach his nails.These nails are painful walking and wearing shoes. Patient presents to the office with male caregiver. This patient presents for at risk foot care today.  General Appearance  Alert, conversant and in no acute stress.  Vascular  Dorsalis pedis and posterior tibial  pulses are palpable  bilaterally.  Capillary return is within normal limits  bilaterally. Temperature is within normal limits  bilaterally.  Neurologic  Senn-Weinstein monofilament wire test within normal limits  bilaterally. Muscle power within normal limits bilaterally.  Nails Thick disfigured discolored nails with subungual debris  from hallux to fifth toes bilaterally. No evidence of bacterial infection or drainage bilaterally.  Orthopedic  No limitations of motion  feet .  No crepitus or effusions noted.  No bony pathology or digital deformities noted.  Skin  normotropic skin with no porokeratosis noted bilaterally.  No signs of infections or ulcers noted.     Onychomycosis  Pain in right toes  Pain in left toes  Consent was obtained for treatment procedures.   Mechanical debridement of nails 1-5  bilaterally performed with a nail nipper.  Filed with dremel without incident.    Return office visit    3 months                  Told patient to return for periodic foot care and evaluation due to potential at risk complications.   Helane Gunther DPM

## 2021-11-09 DIAGNOSIS — F03B3 Unspecified dementia, moderate, with mood disturbance: Secondary | ICD-10-CM | POA: Diagnosis not present

## 2021-11-09 DIAGNOSIS — M6281 Muscle weakness (generalized): Secondary | ICD-10-CM | POA: Diagnosis not present

## 2021-11-09 DIAGNOSIS — R278 Other lack of coordination: Secondary | ICD-10-CM | POA: Diagnosis not present

## 2021-11-09 DIAGNOSIS — R569 Unspecified convulsions: Secondary | ICD-10-CM | POA: Diagnosis not present

## 2021-11-29 DIAGNOSIS — F331 Major depressive disorder, recurrent, moderate: Secondary | ICD-10-CM | POA: Diagnosis not present

## 2021-11-29 DIAGNOSIS — S062X9S Diffuse traumatic brain injury with loss of consciousness of unspecified duration, sequela: Secondary | ICD-10-CM | POA: Diagnosis not present

## 2021-11-29 DIAGNOSIS — F03B3 Unspecified dementia, moderate, with mood disturbance: Secondary | ICD-10-CM | POA: Diagnosis not present

## 2021-12-07 DIAGNOSIS — N183 Chronic kidney disease, stage 3 unspecified: Secondary | ICD-10-CM | POA: Diagnosis not present

## 2021-12-07 DIAGNOSIS — U071 COVID-19: Secondary | ICD-10-CM | POA: Diagnosis not present

## 2021-12-07 DIAGNOSIS — B20 Human immunodeficiency virus [HIV] disease: Secondary | ICD-10-CM | POA: Diagnosis not present

## 2021-12-08 DIAGNOSIS — U071 COVID-19: Secondary | ICD-10-CM | POA: Diagnosis not present

## 2021-12-10 DIAGNOSIS — U071 COVID-19: Secondary | ICD-10-CM | POA: Diagnosis not present

## 2021-12-14 DIAGNOSIS — R569 Unspecified convulsions: Secondary | ICD-10-CM | POA: Diagnosis not present

## 2021-12-14 DIAGNOSIS — N183 Chronic kidney disease, stage 3 unspecified: Secondary | ICD-10-CM | POA: Diagnosis not present

## 2021-12-14 DIAGNOSIS — F32A Depression, unspecified: Secondary | ICD-10-CM | POA: Diagnosis not present

## 2021-12-14 DIAGNOSIS — Z87820 Personal history of traumatic brain injury: Secondary | ICD-10-CM | POA: Diagnosis not present

## 2021-12-14 DIAGNOSIS — B20 Human immunodeficiency virus [HIV] disease: Secondary | ICD-10-CM | POA: Diagnosis not present

## 2021-12-14 DIAGNOSIS — I10 Essential (primary) hypertension: Secondary | ICD-10-CM | POA: Diagnosis not present

## 2021-12-14 DIAGNOSIS — F0150 Vascular dementia without behavioral disturbance: Secondary | ICD-10-CM | POA: Diagnosis not present

## 2021-12-14 DIAGNOSIS — E785 Hyperlipidemia, unspecified: Secondary | ICD-10-CM | POA: Diagnosis not present

## 2021-12-27 DIAGNOSIS — M2041 Other hammer toe(s) (acquired), right foot: Secondary | ICD-10-CM | POA: Diagnosis not present

## 2021-12-27 DIAGNOSIS — B351 Tinea unguium: Secondary | ICD-10-CM | POA: Diagnosis not present

## 2021-12-27 DIAGNOSIS — R262 Difficulty in walking, not elsewhere classified: Secondary | ICD-10-CM | POA: Diagnosis not present

## 2021-12-27 DIAGNOSIS — M2042 Other hammer toe(s) (acquired), left foot: Secondary | ICD-10-CM | POA: Diagnosis not present

## 2021-12-27 DIAGNOSIS — I739 Peripheral vascular disease, unspecified: Secondary | ICD-10-CM | POA: Diagnosis not present

## 2021-12-28 DIAGNOSIS — S062X9S Diffuse traumatic brain injury with loss of consciousness of unspecified duration, sequela: Secondary | ICD-10-CM | POA: Diagnosis not present

## 2021-12-28 DIAGNOSIS — F331 Major depressive disorder, recurrent, moderate: Secondary | ICD-10-CM | POA: Diagnosis not present

## 2021-12-28 DIAGNOSIS — F03B3 Unspecified dementia, moderate, with mood disturbance: Secondary | ICD-10-CM | POA: Diagnosis not present

## 2022-02-01 DIAGNOSIS — R41841 Cognitive communication deficit: Secondary | ICD-10-CM | POA: Diagnosis not present

## 2022-02-01 DIAGNOSIS — R569 Unspecified convulsions: Secondary | ICD-10-CM | POA: Diagnosis not present

## 2022-02-01 DIAGNOSIS — S062X9S Diffuse traumatic brain injury with loss of consciousness of unspecified duration, sequela: Secondary | ICD-10-CM | POA: Diagnosis not present

## 2022-02-01 DIAGNOSIS — F331 Major depressive disorder, recurrent, moderate: Secondary | ICD-10-CM | POA: Diagnosis not present

## 2022-02-01 DIAGNOSIS — F03B3 Unspecified dementia, moderate, with mood disturbance: Secondary | ICD-10-CM | POA: Diagnosis not present

## 2022-02-02 DIAGNOSIS — R569 Unspecified convulsions: Secondary | ICD-10-CM | POA: Diagnosis not present

## 2022-02-02 DIAGNOSIS — R41841 Cognitive communication deficit: Secondary | ICD-10-CM | POA: Diagnosis not present

## 2022-02-11 ENCOUNTER — Encounter: Payer: Self-pay | Admitting: Podiatry

## 2022-02-11 ENCOUNTER — Ambulatory Visit (INDEPENDENT_AMBULATORY_CARE_PROVIDER_SITE_OTHER): Payer: Medicare (Managed Care) | Admitting: Podiatry

## 2022-02-11 DIAGNOSIS — N183 Chronic kidney disease, stage 3 unspecified: Secondary | ICD-10-CM

## 2022-02-11 DIAGNOSIS — M79675 Pain in left toe(s): Secondary | ICD-10-CM | POA: Diagnosis not present

## 2022-02-11 DIAGNOSIS — B351 Tinea unguium: Secondary | ICD-10-CM | POA: Diagnosis not present

## 2022-02-11 DIAGNOSIS — L989 Disorder of the skin and subcutaneous tissue, unspecified: Secondary | ICD-10-CM

## 2022-02-11 DIAGNOSIS — M79674 Pain in right toe(s): Secondary | ICD-10-CM

## 2022-02-11 NOTE — Progress Notes (Signed)
This patient returns to my office for at risk foot care.  This patient requires this care by a professional since this patient will be at risk due to having CKD.    This patient is unable to cut nails himself since the patient cannot reach his nails.These nails are painful walking and wearing shoes. Patient presents to the office with male caregiver. This patient presents for at risk foot care today.  General Appearance  Alert, conversant and in no acute stress.  Vascular  Dorsalis pedis and posterior tibial  pulses are palpable  bilaterally.  Capillary return is within normal limits  bilaterally. Temperature is within normal limits  bilaterally.  Neurologic  Senn-Weinstein monofilament wire test within normal limits  bilaterally. Muscle power within normal limits bilaterally.  Nails Thick disfigured discolored nails with subungual debris  from hallux to fifth toes bilaterally. No evidence of bacterial infection or drainage bilaterally.  Orthopedic  No limitations of motion  feet .  No crepitus or effusions noted.  No bony pathology or digital deformities noted.  Skin  normotropic skin with no porokeratosis noted bilaterally.  No signs of infections or ulcers noted.  Listers corn 5th left.  Pinch callus left hallux.   Onychomycosis  Pain in right toes  Pain in left toes  Consent was obtained for treatment procedures.   Mechanical debridement of nails 1-5  bilaterally performed with a nail nipper.  Filed with dremel without incident. Debride callus with # 15 blade and dremel tool.   Return office visit    3 months                  Told patient to return for periodic foot care and evaluation due to potential at risk complications.   Helane Gunther DPM

## 2022-02-18 DIAGNOSIS — E785 Hyperlipidemia, unspecified: Secondary | ICD-10-CM | POA: Diagnosis not present

## 2022-02-18 DIAGNOSIS — D649 Anemia, unspecified: Secondary | ICD-10-CM | POA: Diagnosis not present

## 2022-02-18 DIAGNOSIS — R569 Unspecified convulsions: Secondary | ICD-10-CM | POA: Diagnosis not present

## 2022-02-26 DIAGNOSIS — W06XXXA Fall from bed, initial encounter: Secondary | ICD-10-CM | POA: Diagnosis not present

## 2022-02-26 DIAGNOSIS — G40909 Epilepsy, unspecified, not intractable, without status epilepticus: Secondary | ICD-10-CM | POA: Diagnosis not present

## 2022-02-26 DIAGNOSIS — Z21 Asymptomatic human immunodeficiency virus [HIV] infection status: Secondary | ICD-10-CM | POA: Diagnosis not present

## 2022-02-26 DIAGNOSIS — N183 Chronic kidney disease, stage 3 unspecified: Secondary | ICD-10-CM | POA: Diagnosis not present

## 2022-02-26 DIAGNOSIS — R569 Unspecified convulsions: Secondary | ICD-10-CM | POA: Diagnosis not present

## 2022-02-26 DIAGNOSIS — S0990XA Unspecified injury of head, initial encounter: Secondary | ICD-10-CM | POA: Diagnosis not present

## 2022-02-26 DIAGNOSIS — Z043 Encounter for examination and observation following other accident: Secondary | ICD-10-CM | POA: Diagnosis not present

## 2022-02-26 DIAGNOSIS — E785 Hyperlipidemia, unspecified: Secondary | ICD-10-CM | POA: Diagnosis not present

## 2022-02-26 DIAGNOSIS — Z87891 Personal history of nicotine dependence: Secondary | ICD-10-CM | POA: Diagnosis not present

## 2022-02-26 DIAGNOSIS — Z743 Need for continuous supervision: Secondary | ICD-10-CM | POA: Diagnosis not present

## 2022-02-26 DIAGNOSIS — F0390 Unspecified dementia without behavioral disturbance: Secondary | ICD-10-CM | POA: Diagnosis not present

## 2022-02-26 DIAGNOSIS — I129 Hypertensive chronic kidney disease with stage 1 through stage 4 chronic kidney disease, or unspecified chronic kidney disease: Secondary | ICD-10-CM | POA: Diagnosis not present

## 2022-02-26 DIAGNOSIS — W19XXXA Unspecified fall, initial encounter: Secondary | ICD-10-CM | POA: Diagnosis not present

## 2022-02-26 DIAGNOSIS — G8190 Hemiplegia, unspecified affecting unspecified side: Secondary | ICD-10-CM | POA: Diagnosis not present

## 2022-02-26 DIAGNOSIS — M542 Cervicalgia: Secondary | ICD-10-CM | POA: Diagnosis not present

## 2022-02-26 DIAGNOSIS — R531 Weakness: Secondary | ICD-10-CM | POA: Diagnosis not present

## 2022-03-01 DIAGNOSIS — B20 Human immunodeficiency virus [HIV] disease: Secondary | ICD-10-CM | POA: Diagnosis not present

## 2022-03-01 DIAGNOSIS — R569 Unspecified convulsions: Secondary | ICD-10-CM | POA: Diagnosis not present

## 2022-03-07 DIAGNOSIS — I739 Peripheral vascular disease, unspecified: Secondary | ICD-10-CM | POA: Diagnosis not present

## 2022-03-07 DIAGNOSIS — B351 Tinea unguium: Secondary | ICD-10-CM | POA: Diagnosis not present

## 2022-03-07 DIAGNOSIS — M2041 Other hammer toe(s) (acquired), right foot: Secondary | ICD-10-CM | POA: Diagnosis not present

## 2022-03-07 DIAGNOSIS — M2042 Other hammer toe(s) (acquired), left foot: Secondary | ICD-10-CM | POA: Diagnosis not present

## 2022-03-09 DIAGNOSIS — R569 Unspecified convulsions: Secondary | ICD-10-CM | POA: Diagnosis not present

## 2022-03-09 DIAGNOSIS — R410 Disorientation, unspecified: Secondary | ICD-10-CM | POA: Diagnosis not present

## 2022-03-09 DIAGNOSIS — R0602 Shortness of breath: Secondary | ICD-10-CM | POA: Diagnosis not present

## 2022-03-14 DIAGNOSIS — Z205 Contact with and (suspected) exposure to viral hepatitis: Secondary | ICD-10-CM | POA: Diagnosis not present

## 2022-03-14 DIAGNOSIS — B20 Human immunodeficiency virus [HIV] disease: Secondary | ICD-10-CM | POA: Diagnosis not present

## 2022-03-14 DIAGNOSIS — Z79899 Other long term (current) drug therapy: Secondary | ICD-10-CM | POA: Diagnosis not present

## 2022-03-14 DIAGNOSIS — G40909 Epilepsy, unspecified, not intractable, without status epilepticus: Secondary | ICD-10-CM | POA: Diagnosis not present

## 2022-03-14 DIAGNOSIS — Z1159 Encounter for screening for other viral diseases: Secondary | ICD-10-CM | POA: Diagnosis not present

## 2022-03-14 DIAGNOSIS — S069X0S Unspecified intracranial injury without loss of consciousness, sequela: Secondary | ICD-10-CM | POA: Diagnosis not present

## 2022-03-15 DIAGNOSIS — F03B3 Unspecified dementia, moderate, with mood disturbance: Secondary | ICD-10-CM | POA: Diagnosis not present

## 2022-03-15 DIAGNOSIS — S062X9S Diffuse traumatic brain injury with loss of consciousness of unspecified duration, sequela: Secondary | ICD-10-CM | POA: Diagnosis not present

## 2022-03-15 DIAGNOSIS — F331 Major depressive disorder, recurrent, moderate: Secondary | ICD-10-CM | POA: Diagnosis not present

## 2022-03-23 DIAGNOSIS — G40909 Epilepsy, unspecified, not intractable, without status epilepticus: Secondary | ICD-10-CM | POA: Insufficient documentation

## 2022-03-23 DIAGNOSIS — H547 Unspecified visual loss: Secondary | ICD-10-CM | POA: Insufficient documentation

## 2022-04-05 DIAGNOSIS — I10 Essential (primary) hypertension: Secondary | ICD-10-CM | POA: Diagnosis not present

## 2022-04-05 DIAGNOSIS — B20 Human immunodeficiency virus [HIV] disease: Secondary | ICD-10-CM | POA: Diagnosis not present

## 2022-04-05 DIAGNOSIS — F0150 Vascular dementia without behavioral disturbance: Secondary | ICD-10-CM | POA: Diagnosis not present

## 2022-04-05 DIAGNOSIS — E785 Hyperlipidemia, unspecified: Secondary | ICD-10-CM | POA: Diagnosis not present

## 2022-04-05 DIAGNOSIS — E871 Hypo-osmolality and hyponatremia: Secondary | ICD-10-CM | POA: Diagnosis not present

## 2022-04-05 DIAGNOSIS — R569 Unspecified convulsions: Secondary | ICD-10-CM | POA: Diagnosis not present

## 2022-04-05 DIAGNOSIS — N183 Chronic kidney disease, stage 3 unspecified: Secondary | ICD-10-CM | POA: Diagnosis not present

## 2022-04-05 DIAGNOSIS — Z87820 Personal history of traumatic brain injury: Secondary | ICD-10-CM | POA: Diagnosis not present

## 2022-04-12 DIAGNOSIS — S062X9S Diffuse traumatic brain injury with loss of consciousness of unspecified duration, sequela: Secondary | ICD-10-CM | POA: Diagnosis not present

## 2022-04-12 DIAGNOSIS — F331 Major depressive disorder, recurrent, moderate: Secondary | ICD-10-CM | POA: Diagnosis not present

## 2022-04-12 DIAGNOSIS — F03B3 Unspecified dementia, moderate, with mood disturbance: Secondary | ICD-10-CM | POA: Diagnosis not present

## 2022-04-28 DIAGNOSIS — R41841 Cognitive communication deficit: Secondary | ICD-10-CM | POA: Diagnosis not present

## 2022-04-28 DIAGNOSIS — R569 Unspecified convulsions: Secondary | ICD-10-CM | POA: Diagnosis not present

## 2022-05-02 ENCOUNTER — Ambulatory Visit (INDEPENDENT_AMBULATORY_CARE_PROVIDER_SITE_OTHER): Payer: Medicare (Managed Care) | Admitting: Family

## 2022-05-02 ENCOUNTER — Encounter: Payer: Self-pay | Admitting: Family

## 2022-05-02 VITALS — BP 138/88 | HR 60 | Temp 98.0°F | Resp 16 | Ht 64.0 in | Wt 176.6 lb

## 2022-05-02 DIAGNOSIS — N183 Chronic kidney disease, stage 3 unspecified: Secondary | ICD-10-CM

## 2022-05-02 DIAGNOSIS — I129 Hypertensive chronic kidney disease with stage 1 through stage 4 chronic kidney disease, or unspecified chronic kidney disease: Secondary | ICD-10-CM

## 2022-05-02 DIAGNOSIS — K219 Gastro-esophageal reflux disease without esophagitis: Secondary | ICD-10-CM | POA: Diagnosis not present

## 2022-05-02 DIAGNOSIS — Z1211 Encounter for screening for malignant neoplasm of colon: Secondary | ICD-10-CM

## 2022-05-02 DIAGNOSIS — E785 Hyperlipidemia, unspecified: Secondary | ICD-10-CM

## 2022-05-02 DIAGNOSIS — N1831 Chronic kidney disease, stage 3a: Secondary | ICD-10-CM

## 2022-05-02 DIAGNOSIS — Z23 Encounter for immunization: Secondary | ICD-10-CM

## 2022-05-02 DIAGNOSIS — D649 Anemia, unspecified: Secondary | ICD-10-CM | POA: Diagnosis not present

## 2022-05-02 DIAGNOSIS — Z1329 Encounter for screening for other suspected endocrine disorder: Secondary | ICD-10-CM | POA: Diagnosis not present

## 2022-05-02 DIAGNOSIS — I10 Essential (primary) hypertension: Secondary | ICD-10-CM | POA: Diagnosis not present

## 2022-05-02 DIAGNOSIS — N189 Chronic kidney disease, unspecified: Secondary | ICD-10-CM | POA: Diagnosis not present

## 2022-05-02 NOTE — Progress Notes (Signed)
Provider: Marlowe Sax FNP-C   Manasvini Whatley, Nelda Bucks, NP  Patient Care Team: Pernie Grosso, Nelda Bucks, NP as PCP - General (Family Medicine) Carlyle Basques, MD as PCP - Infectious Diseases (Infectious Diseases)  Extended Emergency Contact Information Primary Emergency Contact: [facility] Address: [address], [place] [address] Johnnette Litter of [place] Phone: [phone] Relation: Mother Secondary Emergency Contact: [facility] [place] Mobile Phone: [phone] Relation: Brother  Code Status:  Full Code  Goals of care: Advanced Directive information    05/02/2022    9:04 AM  Advanced Directives  Does Patient Have a Medical Advance Directive? No  Does patient want to make changes to medical advance directive? No - Patient declined     Chief Complaint  Patient presents with   Medical Management of Chronic Issues    6 month follow up.    Health Maintenance    Discuss the need for Colonoscopy, and AWV.   Immunizations    Discuss the need for Shingrix vaccine, Influenza vaccine, and Covid Booster.     HPI:  Pt is a 57 y.o. male seen today for 6 months follow up for medical management of chronic diseases. Had labs done at [facility]. HIV reactive. Hypertension - No facility B/p readings for review.He denies any headache,dizziness,vision changes,fatigue,chest tightness,palpitation,chest pain or shortness of breath.    CR 1.59   Hyperlipidemia - latest LDL 74 done 03/14/2022  Depression - mood stable  GERD - states symptoms stable   Past Medical History:  Diagnosis Date   Abnormality of gait    Depressive disorder, not elsewhere classified    Dysphagia, oral phase    Encounter for long-term (current) use of other medications    H/O traumatic subdural hematoma 2005   History of subarachnoid hemorrhage 2005   Human immunodeficiency virus (HIV) disease ([place])    Hyperosmolality and/or hypernatremia    Impacted cerumen    Injury to  unspecified blood vessel of head and neck    Insomnia, unspecified    Moderate intellectual disabilities    Onychia and paronychia of toe    Other abnormal blood chemistry    Other and unspecified hyperlipidemia    Other malaise and fatigue    Pain in limb    Reflux esophagitis    Unspecified disorder of liver    Unspecified essential hypertension    Unspecified nonpsychotic mental disorder    Unspecified psychosis    Ventilator dependence ([place]) 2005   history of    Past Surgical History:  Procedure Laterality Date   CRANIOTOMY  2005   after jumping from bridge   PEG PLACEMENT  2005   Teeth pulled  2013   TRACHEAL SURGERY  2005    No Known Allergies  Allergies as of 05/02/2022   No Known Allergies      Medication List        Accurate as of May 02, 2022  9:07 AM. If you have any questions, ask your nurse or doctor.          Biktarvy 50-200-25 MG Tabs tablet Generic drug: bictegravir-emtricitabine-tenofovir AF Take 1 tablet by mouth daily.   buPROPion 150 MG 12 hr tablet Commonly known as: WELLBUTRIN SR TAKE 1 TABLET(150 MG) BY MOUTH TWICE DAILY   calcium carbonate 500 MG chewable tablet Commonly known as: TUMS - dosed in mg elemental calcium Chew 1 tablet by mouth 2 (two) times daily.   ferrous sulfate 300 (60  Fe) MG/5ML syrup Take 300 mg by mouth 3 (three) times daily with meals.   hydrALAZINE 25 MG tablet Commonly known as: APRESOLINE Take 12.5 mg by mouth every 8 (eight) hours as needed.   levETIRAcetam 750 MG tablet Commonly known as: KEPPRA Take 750 mg by mouth 2 (two) times daily.   pantoprazole 40 MG tablet Commonly known as: PROTONIX Take 40 mg by mouth daily.   Pitavastatin Calcium 4 MG Tabs Take 1 tablet (4 mg total) by mouth daily. Take instead of artovastatin   polyethylene glycol powder 17 GM/SCOOP powder Commonly known as: GLYCOLAX/MIRALAX Take 1 Container by mouth daily as needed.   sennosides-docusate sodium 8.6-50 MG  tablet Commonly known as: SENOKOT-S Take 1 tablet by mouth daily.   sertraline 25 MG tablet Commonly known as: ZOLOFT Take 25 mg by mouth daily.   sodium bicarbonate 650 MG tablet Take 650 mg by mouth 2 (two) times daily.        Review of Systems  Constitutional:  Negative for appetite change, chills, fatigue, fever and unexpected weight change.  HENT:  Negative for congestion, dental problem, ear discharge, ear pain, facial swelling, hearing loss, nosebleeds, postnasal drip, rhinorrhea, sinus pressure, sinus pain, sneezing, sore throat, tinnitus and trouble swallowing.   Eyes:  Negative for pain, discharge, redness, itching and visual disturbance.  Respiratory:  Negative for cough, chest tightness, shortness of breath and wheezing.   Cardiovascular:  Negative for chest pain, palpitations and leg swelling.  Gastrointestinal:  Negative for abdominal distention, abdominal pain, blood in stool, constipation, diarrhea, nausea and vomiting.  Endocrine: Negative for cold intolerance, heat intolerance, polydipsia, polyphagia and polyuria.  Genitourinary:  Negative for difficulty urinating, dysuria, flank pain, frequency and urgency.  Musculoskeletal:  Positive for gait problem. Negative for arthralgias, back pain, joint swelling, myalgias, neck pain and neck stiffness.  Skin:  Negative for color change, pallor, rash and wound.  Neurological:  Negative for dizziness, syncope, speech difficulty, weakness, light-headedness, numbness and headaches.  Hematological:  Does not bruise/bleed easily.  Psychiatric/Behavioral:  Positive for behavioral problems. Negative for agitation, confusion, hallucinations, self-injury, sleep disturbance and suicidal ideas. The patient is not nervous/anxious.        Memory lapse    Immunization History  Administered Date(s) Administered   H1N1 07/03/2008   Hepatitis B 12/28/2006, 07/03/2008, 12/18/2008   Influenza Split 02/03/2005, 12/28/2006, 01/29/2010,  01/03/2011, 01/04/2012   Influenza Whole 02/03/2005, 12/28/2006, 01/29/2010   Influenza,inj,Quad PF,6+ Mos 01/29/2013, 12/17/2013, 02/11/2016, 02/12/2018, 02/20/2020, 04/29/2021   Influenza-Unspecified 12/04/2014, 12/03/2016   Meningococcal Mcv4o 03/15/2016   PFIZER Comirnaty(Gray Top)Covid-19 Tri-Sucrose Vaccine 05/08/2020   PNEUMOCOCCAL CONJUGATE-20 07/27/2021   Pneumococcal Conjugate-13 08/04/2014   Pneumococcal Polysaccharide-23 02/23/2005, 11/04/2010   Pneumococcal-Unspecified 02/23/2005, 11/04/2010   Tdap 05/07/2020   Pertinent  Health Maintenance Due  Topic Date Due   COLONOSCOPY (Pts 45-66yrs Insurance coverage will need to be confirmed)  Never done   INFLUENZA VACCINE  11/02/2021      02/20/2020    8:24 AM 04/29/2021    3:49 PM 05/05/2021    4:48 PM 10/28/2021    2:04 PM 05/02/2022    9:04 AM  [place] in the past year? 0 0 0 0 0  Was there an injury with Fall? 0 0 0 0 0  Fall Risk Category Calculator 0 0 0 0 0  Fall Risk Category (Retired) Low Low Low Low   (RETIRED) Patient Fall Risk Level  Low fall risk Low fall risk Low fall risk  Patient at Risk for Falls Due to  No Fall Risks No Fall Risks No Fall Risks History of fall(s)  Fall risk Follow up  Falls evaluation completed Falls evaluation completed Falls evaluation completed Falls evaluation completed   Functional Status Survey:    Vitals:   05/02/22 0903  BP: 138/88  Pulse: 60  Resp: 16  Temp: 98 F (36.7 C)  SpO2: 96%  Weight: 176 lb 9.6 oz (80.1 kg)  Height: 5\' 4"  (1.626 m)   Body mass index is 30.31 kg/m. Physical Exam Vitals reviewed.  Constitutional:      General: He is not in acute distress.    Appearance: Normal appearance. He is obese. He is not ill-appearing or diaphoretic.  HENT:     Head: Normocephalic.     Right Ear: There is impacted cerumen.     Left Ear: There is impacted cerumen.     Nose: Nose normal. No congestion or rhinorrhea.     Mouth/Throat:     Mouth: Mucous  membranes are moist.     Pharynx: Oropharynx is clear. No oropharyngeal exudate or posterior oropharyngeal erythema.  Eyes:     General: No scleral icterus.       Right eye: No discharge.        Left eye: No discharge.     Extraocular Movements: Extraocular movements intact.     Conjunctiva/sclera: Conjunctivae normal.     Pupils: Pupils are equal, round, and reactive to light.  Neck:     Vascular: No carotid bruit.  Cardiovascular:     Rate and Rhythm: Normal rate and regular rhythm.     Pulses: Normal pulses.     Heart sounds: Normal heart sounds. No murmur heard.    No friction rub. No gallop.  Pulmonary:     Effort: Pulmonary effort is normal. No respiratory distress.     Breath sounds: Normal breath sounds. No wheezing, rhonchi or rales.  Chest:     Chest wall: No tenderness.  Abdominal:     General: Bowel sounds are normal. There is no distension.     Palpations: Abdomen is soft. There is no mass.     Tenderness: There is no abdominal tenderness. There is no right CVA tenderness, left CVA tenderness, guarding or rebound.  Musculoskeletal:        General: No swelling or tenderness. Normal range of motion.     Cervical back: Normal range of motion. No rigidity or tenderness.     Right lower leg: No edema.     Left lower leg: No edema.  Lymphadenopathy:     Cervical: No cervical adenopathy.  Skin:    General: Skin is warm and dry.     Coloration: Skin is not pale.     Findings: No bruising, erythema, lesion or rash.  Neurological:     Mental Status: He is alert and oriented to person, place, and time.     Cranial Nerves: No cranial nerve deficit.     Sensory: No sensory deficit.     Motor: No weakness.     Coordination: Coordination normal.     Gait: Gait abnormal.  Psychiatric:        Mood and Affect: Mood normal.        Speech: Speech normal.        Behavior: Behavior normal.        Thought Content: Thought content normal.        Cognition and Memory: Cognition is  impaired. Memory  is impaired.        Judgment: Judgment is inappropriate.     Labs reviewed: Recent Labs    07/27/21 1647  NA 144  K 4.5  CL 109  CO2 24  GLUCOSE 95  BUN 21  CREATININE 1.64*  CALCIUM 9.9   Recent Labs    07/27/21 1647  AST 20  ALT 27  BILITOT 0.2  PROT 7.9   Recent Labs    07/27/21 1647  WBC 4.5  NEUTROABS 2,772  HGB 14.8  HCT 47.8  MCV 86.3  PLT 221   Lab Results  Component Value Date   TSH 0.957 04/18/2014   Lab Results  Component Value Date   HGBA1C 5.5 09/05/2017   Lab Results  Component Value Date   CHOL 158 07/27/2021   HDL 75 07/27/2021   LDLCALC 65 07/27/2021   TRIG 96 07/27/2021   CHOLHDL 2.1 07/27/2021    Significant Diagnostic Results in last 30 days:  No results found.  Assessment/Plan  1. Benign hypertension with chronic kidney disease, stage III (HCC) Blood pressure well-controlled -Continue on hydralazine - TSH; Future - COMPLETE METABOLIC PANEL WITH GFR; Future - CBC with Differential/Platelet; Future  2. Hyperlipidemia with target LDL less than 100 LDL at goal Continue on statin Dietary modification advised - Lipid panel; Future  3. Gastroesophageal reflux disease without esophagitis Symptoms controlled. H/H stable.No tarry or black stool  - advised to avoid eating meals late in the evening and to avoid aggravating foods and spices. - continue on Protonix  - CBC with Differential/Platelet; Future  4. Stage 3a chronic kidney disease (HCC) CR 1.64 on chart  Will continue to avoid Nephrotoxins and dose all other medication for renal clearance  - COMPLETE METABOLIC PANEL WITH GFR; Future  5. Colon cancer screening Asymptomatic  Colonoscopy versus Cologuard discussed.Prefers Cologuard. - Cologuard  6. Need for immunization against influenza Afebrile - Flut shot administered by CMA no acute reaction reported.  - Flu Vaccine QUAD 10mo+IM (Fluarix, Fluzone & Alfiuria Quad PF)  Family/ staff  Communication: Reviewed plan of care with patient verbalized understanding  Labs/tests ordered:  - Cologuard - TSH; Future - COMPLETE METABOLIC PANEL WITH GFR; Future - CBC with Differential/Platelet; Future  Next Appointment : Return in about 6 months (around 10/31/2022) for medical mangement of chronic issues.Caesar Bookman, NP

## 2022-05-05 DIAGNOSIS — I10 Essential (primary) hypertension: Secondary | ICD-10-CM | POA: Diagnosis not present

## 2022-05-05 DIAGNOSIS — Z87891 Personal history of nicotine dependence: Secondary | ICD-10-CM | POA: Diagnosis not present

## 2022-05-05 DIAGNOSIS — F0390 Unspecified dementia without behavioral disturbance: Secondary | ICD-10-CM | POA: Diagnosis not present

## 2022-05-05 DIAGNOSIS — D631 Anemia in chronic kidney disease: Secondary | ICD-10-CM | POA: Diagnosis not present

## 2022-05-05 DIAGNOSIS — R918 Other nonspecific abnormal finding of lung field: Secondary | ICD-10-CM | POA: Diagnosis not present

## 2022-05-05 DIAGNOSIS — Z7401 Bed confinement status: Secondary | ICD-10-CM | POA: Diagnosis not present

## 2022-05-05 DIAGNOSIS — I129 Hypertensive chronic kidney disease with stage 1 through stage 4 chronic kidney disease, or unspecified chronic kidney disease: Secondary | ICD-10-CM | POA: Diagnosis not present

## 2022-05-05 DIAGNOSIS — U071 COVID-19: Secondary | ICD-10-CM | POA: Diagnosis not present

## 2022-05-05 DIAGNOSIS — R059 Cough, unspecified: Secondary | ICD-10-CM | POA: Diagnosis not present

## 2022-05-05 DIAGNOSIS — N183 Chronic kidney disease, stage 3 unspecified: Secondary | ICD-10-CM | POA: Diagnosis not present

## 2022-05-05 DIAGNOSIS — Z743 Need for continuous supervision: Secondary | ICD-10-CM | POA: Diagnosis not present

## 2022-05-05 DIAGNOSIS — R569 Unspecified convulsions: Secondary | ICD-10-CM | POA: Diagnosis not present

## 2022-05-05 DIAGNOSIS — B20 Human immunodeficiency virus [HIV] disease: Secondary | ICD-10-CM | POA: Diagnosis not present

## 2022-05-06 DIAGNOSIS — U071 COVID-19: Secondary | ICD-10-CM | POA: Diagnosis not present

## 2022-05-06 DIAGNOSIS — R569 Unspecified convulsions: Secondary | ICD-10-CM | POA: Diagnosis not present

## 2022-05-12 ENCOUNTER — Ambulatory Visit: Payer: Medicare (Managed Care) | Admitting: Podiatry

## 2022-05-13 ENCOUNTER — Ambulatory Visit: Payer: Medicare (Managed Care) | Admitting: Podiatry

## 2022-05-16 ENCOUNTER — Encounter: Payer: Self-pay | Admitting: Podiatry

## 2022-05-16 ENCOUNTER — Ambulatory Visit (INDEPENDENT_AMBULATORY_CARE_PROVIDER_SITE_OTHER): Payer: Medicare (Managed Care) | Admitting: Podiatry

## 2022-05-16 VITALS — BP 176/97 | HR 61

## 2022-05-16 DIAGNOSIS — Q828 Other specified congenital malformations of skin: Secondary | ICD-10-CM | POA: Diagnosis not present

## 2022-05-16 DIAGNOSIS — L989 Disorder of the skin and subcutaneous tissue, unspecified: Secondary | ICD-10-CM | POA: Diagnosis not present

## 2022-05-16 DIAGNOSIS — N183 Chronic kidney disease, stage 3 unspecified: Secondary | ICD-10-CM

## 2022-05-16 DIAGNOSIS — B351 Tinea unguium: Secondary | ICD-10-CM

## 2022-05-16 DIAGNOSIS — M79675 Pain in left toe(s): Secondary | ICD-10-CM | POA: Diagnosis not present

## 2022-05-16 DIAGNOSIS — M79674 Pain in right toe(s): Secondary | ICD-10-CM

## 2022-05-16 NOTE — Progress Notes (Signed)
This patient returns to my office for at risk foot care.  This patient requires this care by a professional since this patient will be at risk due to having CKD.    This patient is unable to cut nails himself since the patient cannot reach his nails.These nails are painful walking and wearing shoes. Patient presents to the office with male caregiver. This patient presents for at risk foot care today.  General Appearance  Alert, conversant and in no acute stress.  Vascular  Dorsalis pedis and posterior tibial  pulses are palpable  bilaterally.  Capillary return is within normal limits  bilaterally. Temperature is within normal limits  bilaterally.  Neurologic  Senn-Weinstein monofilament wire test within normal limits  bilaterally. Muscle power within normal limits bilaterally.  Nails Thick disfigured discolored nails with subungual debris  from hallux to fifth toes bilaterally. No evidence of bacterial infection or drainage bilaterally.  Orthopedic  No limitations of motion  feet .  No crepitus or effusions noted.  No bony pathology or digital deformities noted.  Skin  normotropic skin with no porokeratosis noted bilaterally.  No signs of infections or ulcers noted.  Listers corn 5th left.  Pinch callus left hallux. Callus sub 2 left foot.  Onychomycosis  Pain in right toes  Pain in left toesCallus left foot.  Consent was obtained for treatment procedures.   Mechanical debridement of nails 1-5  bilaterally performed with a nail nipper.  Filed with dremel without incident. Debride callus with # 15 blade and dremel tool.   Return office visit    3 months                  Told patient to return for periodic foot care and evaluation due to potential at risk complications.   Gardiner Barefoot DPM

## 2022-05-18 DIAGNOSIS — F32A Depression, unspecified: Secondary | ICD-10-CM | POA: Diagnosis not present

## 2022-05-18 DIAGNOSIS — G40909 Epilepsy, unspecified, not intractable, without status epilepticus: Secondary | ICD-10-CM | POA: Diagnosis not present

## 2022-05-18 DIAGNOSIS — I10 Essential (primary) hypertension: Secondary | ICD-10-CM | POA: Diagnosis not present

## 2022-05-23 DIAGNOSIS — F03B3 Unspecified dementia, moderate, with mood disturbance: Secondary | ICD-10-CM | POA: Diagnosis not present

## 2022-05-23 DIAGNOSIS — R41841 Cognitive communication deficit: Secondary | ICD-10-CM | POA: Diagnosis not present

## 2022-05-23 DIAGNOSIS — S062X9S Diffuse traumatic brain injury with loss of consciousness of unspecified duration, sequela: Secondary | ICD-10-CM | POA: Diagnosis not present

## 2022-05-23 DIAGNOSIS — R569 Unspecified convulsions: Secondary | ICD-10-CM | POA: Diagnosis not present

## 2022-05-23 DIAGNOSIS — F331 Major depressive disorder, recurrent, moderate: Secondary | ICD-10-CM | POA: Diagnosis not present

## 2022-06-01 DIAGNOSIS — F0150 Vascular dementia without behavioral disturbance: Secondary | ICD-10-CM | POA: Diagnosis not present

## 2022-06-01 DIAGNOSIS — F32A Depression, unspecified: Secondary | ICD-10-CM | POA: Diagnosis not present

## 2022-06-01 DIAGNOSIS — E785 Hyperlipidemia, unspecified: Secondary | ICD-10-CM | POA: Diagnosis not present

## 2022-06-01 DIAGNOSIS — I10 Essential (primary) hypertension: Secondary | ICD-10-CM | POA: Diagnosis not present

## 2022-06-01 DIAGNOSIS — K219 Gastro-esophageal reflux disease without esophagitis: Secondary | ICD-10-CM | POA: Diagnosis not present

## 2022-06-03 DIAGNOSIS — R569 Unspecified convulsions: Secondary | ICD-10-CM | POA: Diagnosis not present

## 2022-06-03 DIAGNOSIS — R41841 Cognitive communication deficit: Secondary | ICD-10-CM | POA: Diagnosis not present

## 2022-06-22 DIAGNOSIS — I10 Essential (primary) hypertension: Secondary | ICD-10-CM | POA: Diagnosis not present

## 2022-06-22 DIAGNOSIS — F32A Depression, unspecified: Secondary | ICD-10-CM | POA: Diagnosis not present

## 2022-06-22 DIAGNOSIS — G40909 Epilepsy, unspecified, not intractable, without status epilepticus: Secondary | ICD-10-CM | POA: Diagnosis not present

## 2022-06-30 DIAGNOSIS — F331 Major depressive disorder, recurrent, moderate: Secondary | ICD-10-CM | POA: Diagnosis not present

## 2022-06-30 DIAGNOSIS — I10 Essential (primary) hypertension: Secondary | ICD-10-CM | POA: Diagnosis not present

## 2022-07-13 DIAGNOSIS — K219 Gastro-esophageal reflux disease without esophagitis: Secondary | ICD-10-CM | POA: Diagnosis not present

## 2022-07-13 DIAGNOSIS — K5900 Constipation, unspecified: Secondary | ICD-10-CM | POA: Diagnosis not present

## 2022-07-13 DIAGNOSIS — F32A Depression, unspecified: Secondary | ICD-10-CM | POA: Diagnosis not present

## 2022-07-13 DIAGNOSIS — I10 Essential (primary) hypertension: Secondary | ICD-10-CM | POA: Diagnosis not present

## 2022-07-14 DIAGNOSIS — B20 Human immunodeficiency virus [HIV] disease: Secondary | ICD-10-CM | POA: Diagnosis not present

## 2022-07-14 DIAGNOSIS — I10 Essential (primary) hypertension: Secondary | ICD-10-CM | POA: Diagnosis not present

## 2022-07-19 DIAGNOSIS — F411 Generalized anxiety disorder: Secondary | ICD-10-CM | POA: Diagnosis not present

## 2022-07-19 DIAGNOSIS — F39 Unspecified mood [affective] disorder: Secondary | ICD-10-CM | POA: Diagnosis not present

## 2022-07-25 ENCOUNTER — Ambulatory Visit: Payer: Medicare (Managed Care) | Admitting: Podiatry

## 2022-07-26 DIAGNOSIS — B20 Human immunodeficiency virus [HIV] disease: Secondary | ICD-10-CM | POA: Diagnosis not present

## 2022-07-26 DIAGNOSIS — K219 Gastro-esophageal reflux disease without esophagitis: Secondary | ICD-10-CM | POA: Diagnosis not present

## 2022-07-26 DIAGNOSIS — E785 Hyperlipidemia, unspecified: Secondary | ICD-10-CM | POA: Diagnosis not present

## 2022-07-26 DIAGNOSIS — I10 Essential (primary) hypertension: Secondary | ICD-10-CM | POA: Diagnosis not present

## 2022-07-26 DIAGNOSIS — R569 Unspecified convulsions: Secondary | ICD-10-CM | POA: Diagnosis not present

## 2022-07-26 DIAGNOSIS — F330 Major depressive disorder, recurrent, mild: Secondary | ICD-10-CM | POA: Diagnosis not present

## 2022-07-26 DIAGNOSIS — E871 Hypo-osmolality and hyponatremia: Secondary | ICD-10-CM | POA: Diagnosis not present

## 2022-07-26 DIAGNOSIS — N183 Chronic kidney disease, stage 3 unspecified: Secondary | ICD-10-CM | POA: Diagnosis not present

## 2022-07-27 DIAGNOSIS — F03B3 Unspecified dementia, moderate, with mood disturbance: Secondary | ICD-10-CM | POA: Diagnosis not present

## 2022-07-27 DIAGNOSIS — F331 Major depressive disorder, recurrent, moderate: Secondary | ICD-10-CM | POA: Diagnosis not present

## 2022-08-09 ENCOUNTER — Encounter: Payer: Self-pay | Admitting: Podiatry

## 2022-08-09 ENCOUNTER — Ambulatory Visit (INDEPENDENT_AMBULATORY_CARE_PROVIDER_SITE_OTHER): Payer: Medicare (Managed Care) | Admitting: Podiatry

## 2022-08-09 DIAGNOSIS — N183 Chronic kidney disease, stage 3 unspecified: Secondary | ICD-10-CM | POA: Diagnosis not present

## 2022-08-09 DIAGNOSIS — M79674 Pain in right toe(s): Secondary | ICD-10-CM | POA: Diagnosis not present

## 2022-08-09 DIAGNOSIS — Q828 Other specified congenital malformations of skin: Secondary | ICD-10-CM | POA: Diagnosis not present

## 2022-08-09 DIAGNOSIS — M79675 Pain in left toe(s): Secondary | ICD-10-CM | POA: Diagnosis not present

## 2022-08-09 DIAGNOSIS — B351 Tinea unguium: Secondary | ICD-10-CM | POA: Diagnosis not present

## 2022-08-09 NOTE — Progress Notes (Signed)
This patient returns to my office for at risk foot care.  This patient requires this care by a professional since this patient will be at risk due to having CKD.    This patient is unable to cut nails himself since the patient cannot reach his nails.These nails are painful walking and wearing shoes. Patient presents to the office with male caregiver. This patient presents for at risk foot care today.  General Appearance  Alert, conversant and in no acute stress.  Vascular  Dorsalis pedis and posterior tibial  pulses are palpable  bilaterally.  Capillary return is within normal limits  bilaterally. Temperature is within normal limits  bilaterally.  Neurologic  Senn-Weinstein monofilament wire test within normal limits  bilaterally. Muscle power within normal limits bilaterally.  Nails Thick disfigured discolored nails with subungual debris  from hallux to fifth toes bilaterally. No evidence of bacterial infection or drainage bilaterally.  Orthopedic  No limitations of motion  feet .  No crepitus or effusions noted.  No bony pathology or digital deformities noted.  Skin  normotropic skin with no porokeratosis noted bilaterally.  No signs of infections or ulcers noted.  Listers corn 5th left.  Pinch callus left hallux. Callus sub 2 left foot.  Onychomycosis  Pain in right toes  Pain in left toesCallus left foot.  Consent was obtained for treatment procedures.   Mechanical debridement of nails 1-5  bilaterally performed with a nail nipper.  Filed with dremel without incident. Debride callus with # 15 blade and dremel tool. Cauterized third toe left since he reacted significantly to dremel tool.   Return office visit    3 months                  Told patient to return for periodic foot care and evaluation due to potential at risk complications.   Helane Gunther DPM

## 2022-08-11 DIAGNOSIS — I10 Essential (primary) hypertension: Secondary | ICD-10-CM | POA: Diagnosis not present

## 2022-08-11 DIAGNOSIS — R569 Unspecified convulsions: Secondary | ICD-10-CM | POA: Diagnosis not present

## 2022-08-11 DIAGNOSIS — B20 Human immunodeficiency virus [HIV] disease: Secondary | ICD-10-CM | POA: Diagnosis not present

## 2022-08-11 DIAGNOSIS — F330 Major depressive disorder, recurrent, mild: Secondary | ICD-10-CM | POA: Diagnosis not present

## 2022-08-16 DIAGNOSIS — L602 Onychogryphosis: Secondary | ICD-10-CM | POA: Diagnosis not present

## 2022-08-16 DIAGNOSIS — I739 Peripheral vascular disease, unspecified: Secondary | ICD-10-CM | POA: Diagnosis not present

## 2022-08-22 ENCOUNTER — Telehealth: Payer: Self-pay | Admitting: Orthopedic Surgery

## 2022-08-23 DIAGNOSIS — F03B3 Unspecified dementia, moderate, with mood disturbance: Secondary | ICD-10-CM | POA: Diagnosis not present

## 2022-08-23 DIAGNOSIS — F331 Major depressive disorder, recurrent, moderate: Secondary | ICD-10-CM | POA: Diagnosis not present

## 2022-08-24 DIAGNOSIS — I10 Essential (primary) hypertension: Secondary | ICD-10-CM | POA: Diagnosis not present

## 2022-08-24 DIAGNOSIS — B20 Human immunodeficiency virus [HIV] disease: Secondary | ICD-10-CM | POA: Diagnosis not present

## 2022-08-25 DIAGNOSIS — K219 Gastro-esophageal reflux disease without esophagitis: Secondary | ICD-10-CM | POA: Diagnosis not present

## 2022-08-25 DIAGNOSIS — I10 Essential (primary) hypertension: Secondary | ICD-10-CM | POA: Diagnosis not present

## 2022-08-25 DIAGNOSIS — K5900 Constipation, unspecified: Secondary | ICD-10-CM | POA: Diagnosis not present

## 2022-08-25 DIAGNOSIS — F330 Major depressive disorder, recurrent, mild: Secondary | ICD-10-CM | POA: Diagnosis not present

## 2022-09-02 DIAGNOSIS — G20A1 Parkinson's disease without dyskinesia, without mention of fluctuations: Secondary | ICD-10-CM | POA: Diagnosis not present

## 2022-09-02 DIAGNOSIS — I10 Essential (primary) hypertension: Secondary | ICD-10-CM | POA: Diagnosis not present

## 2022-09-02 DIAGNOSIS — K219 Gastro-esophageal reflux disease without esophagitis: Secondary | ICD-10-CM | POA: Diagnosis not present

## 2022-09-02 DIAGNOSIS — F02B0 Dementia in other diseases classified elsewhere, moderate, without behavioral disturbance, psychotic disturbance, mood disturbance, and anxiety: Secondary | ICD-10-CM | POA: Diagnosis not present

## 2022-09-02 DIAGNOSIS — R569 Unspecified convulsions: Secondary | ICD-10-CM | POA: Diagnosis not present

## 2022-09-08 DIAGNOSIS — R569 Unspecified convulsions: Secondary | ICD-10-CM | POA: Diagnosis not present

## 2022-09-08 DIAGNOSIS — K219 Gastro-esophageal reflux disease without esophagitis: Secondary | ICD-10-CM | POA: Diagnosis not present

## 2022-09-08 DIAGNOSIS — E871 Hypo-osmolality and hyponatremia: Secondary | ICD-10-CM | POA: Diagnosis not present

## 2022-09-08 DIAGNOSIS — I10 Essential (primary) hypertension: Secondary | ICD-10-CM | POA: Diagnosis not present

## 2022-09-09 DIAGNOSIS — F331 Major depressive disorder, recurrent, moderate: Secondary | ICD-10-CM | POA: Diagnosis not present

## 2022-09-09 DIAGNOSIS — F03B3 Unspecified dementia, moderate, with mood disturbance: Secondary | ICD-10-CM | POA: Diagnosis not present

## 2022-09-13 DIAGNOSIS — I10 Essential (primary) hypertension: Secondary | ICD-10-CM | POA: Diagnosis not present

## 2022-09-13 DIAGNOSIS — E785 Hyperlipidemia, unspecified: Secondary | ICD-10-CM | POA: Diagnosis not present

## 2022-09-14 DIAGNOSIS — M6281 Muscle weakness (generalized): Secondary | ICD-10-CM | POA: Diagnosis not present

## 2022-09-14 DIAGNOSIS — R569 Unspecified convulsions: Secondary | ICD-10-CM | POA: Diagnosis not present

## 2022-09-14 DIAGNOSIS — R278 Other lack of coordination: Secondary | ICD-10-CM | POA: Diagnosis not present

## 2022-09-14 DIAGNOSIS — R41841 Cognitive communication deficit: Secondary | ICD-10-CM | POA: Diagnosis not present

## 2022-09-22 DIAGNOSIS — H2513 Age-related nuclear cataract, bilateral: Secondary | ICD-10-CM | POA: Diagnosis not present

## 2022-09-22 DIAGNOSIS — B20 Human immunodeficiency virus [HIV] disease: Secondary | ICD-10-CM | POA: Diagnosis not present

## 2022-09-22 DIAGNOSIS — H04123 Dry eye syndrome of bilateral lacrimal glands: Secondary | ICD-10-CM | POA: Diagnosis not present

## 2022-09-22 DIAGNOSIS — H47299 Other optic atrophy, unspecified eye: Secondary | ICD-10-CM | POA: Diagnosis not present

## 2022-09-28 DIAGNOSIS — I10 Essential (primary) hypertension: Secondary | ICD-10-CM | POA: Diagnosis not present

## 2022-09-28 DIAGNOSIS — R569 Unspecified convulsions: Secondary | ICD-10-CM | POA: Diagnosis not present

## 2022-09-28 DIAGNOSIS — N183 Chronic kidney disease, stage 3 unspecified: Secondary | ICD-10-CM | POA: Diagnosis not present

## 2022-09-28 DIAGNOSIS — F02B0 Dementia in other diseases classified elsewhere, moderate, without behavioral disturbance, psychotic disturbance, mood disturbance, and anxiety: Secondary | ICD-10-CM | POA: Diagnosis not present

## 2022-09-28 DIAGNOSIS — Z87820 Personal history of traumatic brain injury: Secondary | ICD-10-CM | POA: Diagnosis not present

## 2022-09-28 DIAGNOSIS — B20 Human immunodeficiency virus [HIV] disease: Secondary | ICD-10-CM | POA: Diagnosis not present

## 2022-10-10 DIAGNOSIS — E785 Hyperlipidemia, unspecified: Secondary | ICD-10-CM | POA: Diagnosis not present

## 2022-10-10 DIAGNOSIS — I10 Essential (primary) hypertension: Secondary | ICD-10-CM | POA: Diagnosis not present

## 2022-10-14 DIAGNOSIS — F03B3 Unspecified dementia, moderate, with mood disturbance: Secondary | ICD-10-CM | POA: Diagnosis not present

## 2022-10-14 DIAGNOSIS — F331 Major depressive disorder, recurrent, moderate: Secondary | ICD-10-CM | POA: Diagnosis not present

## 2022-10-19 DIAGNOSIS — K219 Gastro-esophageal reflux disease without esophagitis: Secondary | ICD-10-CM | POA: Diagnosis not present

## 2022-10-19 DIAGNOSIS — K5900 Constipation, unspecified: Secondary | ICD-10-CM | POA: Diagnosis not present

## 2022-10-19 DIAGNOSIS — R569 Unspecified convulsions: Secondary | ICD-10-CM | POA: Diagnosis not present

## 2022-10-20 ENCOUNTER — Encounter: Payer: Self-pay | Admitting: Podiatry

## 2022-10-20 ENCOUNTER — Ambulatory Visit: Payer: Medicare (Managed Care) | Admitting: Podiatry

## 2022-10-20 DIAGNOSIS — L989 Disorder of the skin and subcutaneous tissue, unspecified: Secondary | ICD-10-CM | POA: Diagnosis not present

## 2022-10-20 DIAGNOSIS — F331 Major depressive disorder, recurrent, moderate: Secondary | ICD-10-CM | POA: Diagnosis not present

## 2022-10-20 DIAGNOSIS — Q828 Other specified congenital malformations of skin: Secondary | ICD-10-CM | POA: Diagnosis not present

## 2022-10-20 DIAGNOSIS — N1831 Chronic kidney disease, stage 3a: Secondary | ICD-10-CM

## 2022-10-20 DIAGNOSIS — N183 Chronic kidney disease, stage 3 unspecified: Secondary | ICD-10-CM | POA: Diagnosis not present

## 2022-10-20 NOTE — Progress Notes (Signed)
This patient returns to my office for at risk foot care.  This patient requires this care by a professional since this patient will be at risk due to having CKD.    This patient is unable to trim his callus left foot.These callus are painful walking and wearing shoes.  This patient presents for at risk foot care today. Patient would not let me use the dremel tool in his treatment today.  General Appearance  Alert, conversant and in no acute stress.  Vascular  Dorsalis pedis and posterior tibial  pulses are palpable  bilaterally.  Capillary return is within normal limits  bilaterally. Temperature is within normal limits  bilaterally.  Neurologic  Senn-Weinstein monofilament wire test within normal limits  bilaterally. Muscle power within normal limits bilaterally.  Nails Thick disfigured discolored nails with subungual debris  from hallux to fifth toes bilaterally. No evidence of bacterial infection or drainage bilaterally.  Orthopedic  No limitations of motion  feet .  No crepitus or effusions noted.  No bony pathology or digital deformities noted.  Skin  normotropic skin with no porokeratosis noted bilaterally.  No signs of infections or ulcers noted.  Listers corn 5th left.  Pinch callus left hallux. Callus sub 2 left foot.  Onychomycosis  Pain in right toes  Pain in left toesCallus left foot.  Consent was obtained for treatment procedures.   Mechanical debridement of nails 1-5  bilaterally performed with a nail nipper.  No dremel usage for nails. Debride callus with # 15 blade and dremel tool.    Return office visit    4 months                  Told patient to return for periodic foot care and evaluation due to potential at risk complications.   Helane Gunther DPM

## 2022-10-24 DIAGNOSIS — K219 Gastro-esophageal reflux disease without esophagitis: Secondary | ICD-10-CM | POA: Diagnosis not present

## 2022-10-24 DIAGNOSIS — K5900 Constipation, unspecified: Secondary | ICD-10-CM | POA: Diagnosis not present

## 2022-10-24 DIAGNOSIS — L84 Corns and callosities: Secondary | ICD-10-CM | POA: Diagnosis not present

## 2022-10-24 DIAGNOSIS — I10 Essential (primary) hypertension: Secondary | ICD-10-CM | POA: Diagnosis not present

## 2022-10-31 ENCOUNTER — Ambulatory Visit: Payer: Medicare (Managed Care) | Admitting: Family

## 2022-10-31 ENCOUNTER — Encounter: Payer: Self-pay | Admitting: Family

## 2022-10-31 ENCOUNTER — Encounter: Payer: Medicare (Managed Care) | Admitting: Family

## 2022-10-31 NOTE — Progress Notes (Signed)
  This encounter was created in error - please disregard. No show 

## 2022-11-01 DIAGNOSIS — R2689 Other abnormalities of gait and mobility: Secondary | ICD-10-CM | POA: Diagnosis not present

## 2022-11-01 DIAGNOSIS — F03B3 Unspecified dementia, moderate, with mood disturbance: Secondary | ICD-10-CM | POA: Diagnosis not present

## 2022-11-01 DIAGNOSIS — M6281 Muscle weakness (generalized): Secondary | ICD-10-CM | POA: Diagnosis not present

## 2022-11-01 DIAGNOSIS — G4089 Other seizures: Secondary | ICD-10-CM | POA: Diagnosis not present

## 2022-11-01 DIAGNOSIS — N183 Chronic kidney disease, stage 3 unspecified: Secondary | ICD-10-CM | POA: Diagnosis not present

## 2022-11-03 DIAGNOSIS — F03B3 Unspecified dementia, moderate, with mood disturbance: Secondary | ICD-10-CM | POA: Diagnosis not present

## 2022-11-03 DIAGNOSIS — M6281 Muscle weakness (generalized): Secondary | ICD-10-CM | POA: Diagnosis not present

## 2022-11-03 DIAGNOSIS — G4089 Other seizures: Secondary | ICD-10-CM | POA: Diagnosis not present

## 2022-11-03 DIAGNOSIS — R2689 Other abnormalities of gait and mobility: Secondary | ICD-10-CM | POA: Diagnosis not present

## 2022-11-03 DIAGNOSIS — N183 Chronic kidney disease, stage 3 unspecified: Secondary | ICD-10-CM | POA: Diagnosis not present

## 2022-11-11 ENCOUNTER — Encounter: Payer: Self-pay | Admitting: Family Medicine

## 2022-11-11 DIAGNOSIS — I739 Peripheral vascular disease, unspecified: Secondary | ICD-10-CM | POA: Diagnosis not present

## 2022-11-11 DIAGNOSIS — L602 Onychogryphosis: Secondary | ICD-10-CM | POA: Diagnosis not present

## 2022-11-11 DIAGNOSIS — L603 Nail dystrophy: Secondary | ICD-10-CM | POA: Diagnosis not present

## 2022-11-11 DIAGNOSIS — L84 Corns and callosities: Secondary | ICD-10-CM | POA: Diagnosis not present

## 2022-11-14 ENCOUNTER — Encounter: Payer: Self-pay | Admitting: Family Medicine

## 2022-11-29 DIAGNOSIS — F330 Major depressive disorder, recurrent, mild: Secondary | ICD-10-CM | POA: Diagnosis not present

## 2022-11-29 DIAGNOSIS — G40909 Epilepsy, unspecified, not intractable, without status epilepticus: Secondary | ICD-10-CM | POA: Diagnosis not present

## 2022-11-29 DIAGNOSIS — F01B3 Vascular dementia, moderate, with mood disturbance: Secondary | ICD-10-CM | POA: Diagnosis not present

## 2022-12-05 DIAGNOSIS — M6281 Muscle weakness (generalized): Secondary | ICD-10-CM | POA: Diagnosis not present

## 2022-12-05 DIAGNOSIS — R2689 Other abnormalities of gait and mobility: Secondary | ICD-10-CM | POA: Diagnosis not present

## 2022-12-05 DIAGNOSIS — N183 Chronic kidney disease, stage 3 unspecified: Secondary | ICD-10-CM | POA: Diagnosis not present

## 2022-12-05 DIAGNOSIS — F03B3 Unspecified dementia, moderate, with mood disturbance: Secondary | ICD-10-CM | POA: Diagnosis not present

## 2022-12-05 DIAGNOSIS — G4089 Other seizures: Secondary | ICD-10-CM | POA: Diagnosis not present

## 2022-12-13 DIAGNOSIS — B20 Human immunodeficiency virus [HIV] disease: Secondary | ICD-10-CM | POA: Diagnosis not present

## 2022-12-13 DIAGNOSIS — G40909 Epilepsy, unspecified, not intractable, without status epilepticus: Secondary | ICD-10-CM | POA: Diagnosis not present

## 2022-12-13 DIAGNOSIS — I10 Essential (primary) hypertension: Secondary | ICD-10-CM | POA: Diagnosis not present

## 2022-12-13 DIAGNOSIS — Z7189 Other specified counseling: Secondary | ICD-10-CM | POA: Diagnosis not present

## 2022-12-15 DIAGNOSIS — F331 Major depressive disorder, recurrent, moderate: Secondary | ICD-10-CM | POA: Diagnosis not present

## 2022-12-27 DIAGNOSIS — K219 Gastro-esophageal reflux disease without esophagitis: Secondary | ICD-10-CM | POA: Diagnosis not present

## 2022-12-27 DIAGNOSIS — F01B3 Vascular dementia, moderate, with mood disturbance: Secondary | ICD-10-CM | POA: Diagnosis not present

## 2022-12-27 DIAGNOSIS — F02B0 Dementia in other diseases classified elsewhere, moderate, without behavioral disturbance, psychotic disturbance, mood disturbance, and anxiety: Secondary | ICD-10-CM | POA: Diagnosis not present

## 2022-12-27 DIAGNOSIS — I10 Essential (primary) hypertension: Secondary | ICD-10-CM | POA: Diagnosis not present

## 2022-12-27 DIAGNOSIS — B20 Human immunodeficiency virus [HIV] disease: Secondary | ICD-10-CM | POA: Diagnosis not present

## 2022-12-27 DIAGNOSIS — Z87820 Personal history of traumatic brain injury: Secondary | ICD-10-CM | POA: Diagnosis not present

## 2022-12-27 DIAGNOSIS — G40909 Epilepsy, unspecified, not intractable, without status epilepticus: Secondary | ICD-10-CM | POA: Diagnosis not present

## 2022-12-27 DIAGNOSIS — N183 Chronic kidney disease, stage 3 unspecified: Secondary | ICD-10-CM | POA: Diagnosis not present

## 2023-01-03 DIAGNOSIS — R2689 Other abnormalities of gait and mobility: Secondary | ICD-10-CM | POA: Diagnosis not present

## 2023-01-03 DIAGNOSIS — F03B3 Unspecified dementia, moderate, with mood disturbance: Secondary | ICD-10-CM | POA: Diagnosis not present

## 2023-01-03 DIAGNOSIS — M6281 Muscle weakness (generalized): Secondary | ICD-10-CM | POA: Diagnosis not present

## 2023-01-03 DIAGNOSIS — G4089 Other seizures: Secondary | ICD-10-CM | POA: Diagnosis not present

## 2023-01-03 DIAGNOSIS — N183 Chronic kidney disease, stage 3 unspecified: Secondary | ICD-10-CM | POA: Diagnosis not present

## 2023-01-11 DIAGNOSIS — I10 Essential (primary) hypertension: Secondary | ICD-10-CM | POA: Diagnosis not present

## 2023-01-11 DIAGNOSIS — F02B0 Dementia in other diseases classified elsewhere, moderate, without behavioral disturbance, psychotic disturbance, mood disturbance, and anxiety: Secondary | ICD-10-CM | POA: Diagnosis not present

## 2023-01-11 DIAGNOSIS — G40909 Epilepsy, unspecified, not intractable, without status epilepticus: Secondary | ICD-10-CM | POA: Diagnosis not present

## 2023-01-11 DIAGNOSIS — D649 Anemia, unspecified: Secondary | ICD-10-CM | POA: Diagnosis not present

## 2023-01-11 DIAGNOSIS — E785 Hyperlipidemia, unspecified: Secondary | ICD-10-CM | POA: Diagnosis not present

## 2023-01-12 DIAGNOSIS — F331 Major depressive disorder, recurrent, moderate: Secondary | ICD-10-CM | POA: Diagnosis not present

## 2023-01-17 DIAGNOSIS — B20 Human immunodeficiency virus [HIV] disease: Secondary | ICD-10-CM | POA: Diagnosis not present

## 2023-01-17 DIAGNOSIS — R059 Cough, unspecified: Secondary | ICD-10-CM | POA: Diagnosis not present

## 2023-01-18 DIAGNOSIS — I739 Peripheral vascular disease, unspecified: Secondary | ICD-10-CM | POA: Diagnosis not present

## 2023-01-18 DIAGNOSIS — L84 Corns and callosities: Secondary | ICD-10-CM | POA: Diagnosis not present

## 2023-01-18 DIAGNOSIS — R062 Wheezing: Secondary | ICD-10-CM | POA: Diagnosis not present

## 2023-01-18 DIAGNOSIS — L603 Nail dystrophy: Secondary | ICD-10-CM | POA: Diagnosis not present

## 2023-01-18 DIAGNOSIS — L602 Onychogryphosis: Secondary | ICD-10-CM | POA: Diagnosis not present

## 2023-01-19 DIAGNOSIS — K219 Gastro-esophageal reflux disease without esophagitis: Secondary | ICD-10-CM | POA: Diagnosis not present

## 2023-01-19 DIAGNOSIS — J189 Pneumonia, unspecified organism: Secondary | ICD-10-CM | POA: Diagnosis not present

## 2023-01-19 DIAGNOSIS — I10 Essential (primary) hypertension: Secondary | ICD-10-CM | POA: Diagnosis not present

## 2023-01-19 DIAGNOSIS — R059 Cough, unspecified: Secondary | ICD-10-CM | POA: Diagnosis not present

## 2023-02-03 DIAGNOSIS — I10 Essential (primary) hypertension: Secondary | ICD-10-CM | POA: Diagnosis not present

## 2023-02-03 DIAGNOSIS — F330 Major depressive disorder, recurrent, mild: Secondary | ICD-10-CM | POA: Diagnosis not present

## 2023-02-03 DIAGNOSIS — G40909 Epilepsy, unspecified, not intractable, without status epilepticus: Secondary | ICD-10-CM | POA: Diagnosis not present

## 2023-02-20 ENCOUNTER — Encounter: Payer: Self-pay | Admitting: Podiatry

## 2023-02-20 ENCOUNTER — Ambulatory Visit (INDEPENDENT_AMBULATORY_CARE_PROVIDER_SITE_OTHER): Payer: Medicare (Managed Care) | Admitting: Podiatry

## 2023-02-20 DIAGNOSIS — B351 Tinea unguium: Secondary | ICD-10-CM | POA: Diagnosis not present

## 2023-02-20 DIAGNOSIS — Q828 Other specified congenital malformations of skin: Secondary | ICD-10-CM | POA: Diagnosis not present

## 2023-02-20 DIAGNOSIS — M79674 Pain in right toe(s): Secondary | ICD-10-CM

## 2023-02-20 DIAGNOSIS — M79675 Pain in left toe(s): Secondary | ICD-10-CM | POA: Diagnosis not present

## 2023-02-20 DIAGNOSIS — N1831 Chronic kidney disease, stage 3a: Secondary | ICD-10-CM

## 2023-02-20 DIAGNOSIS — F02818 Dementia in other diseases classified elsewhere, unspecified severity, with other behavioral disturbance: Secondary | ICD-10-CM | POA: Diagnosis not present

## 2023-02-20 NOTE — Progress Notes (Signed)
This patient returns to my office for at risk foot care.  This patient requires this care by a professional since this patient will be at risk due to having CKD.    This patient is unable to trim his callus left foot.These callus are painful walking and wearing shoes.  This patient presents for at risk foot care today. Patient would not let me use the dremel tool in his treatment today.  General Appearance  Alert, conversant and in no acute stress.  Vascular  Dorsalis pedis and posterior tibial  pulses are palpable  bilaterally.  Capillary return is within normal limits  bilaterally. Temperature is within normal limits  bilaterally.  Neurologic  Senn-Weinstein monofilament wire test within normal limits  bilaterally. Muscle power within normal limits bilaterally.  Nails Thick disfigured discolored nails with subungual debris  from hallux to fifth toes bilaterally. No evidence of bacterial infection or drainage bilaterally.  Orthopedic  No limitations of motion  feet .  No crepitus or effusions noted.  No bony pathology or digital deformities noted.  Skin  normotropic skin with no porokeratosis noted bilaterally.  No signs of infections or ulcers noted.  Listers corn 5th left.  Pinch callus left hallux. Callus sub 2 left foot.  Onychomycosis  Pain in right toes  Pain in left toesCallus left foot.  Consent was obtained for treatment procedures.   Mechanical debridement of nails 1-5  bilaterally performed with a nail nipper.  No dremel usage for nails. Debride callus with # 15 blade and dremel tool.    Return office visit    4 months                  Told patient to return for periodic foot care and evaluation due to potential at risk complications.   Helane Gunther DPM

## 2023-02-27 DIAGNOSIS — B20 Human immunodeficiency virus [HIV] disease: Secondary | ICD-10-CM | POA: Diagnosis not present

## 2023-02-27 DIAGNOSIS — E785 Hyperlipidemia, unspecified: Secondary | ICD-10-CM | POA: Diagnosis not present

## 2023-02-27 DIAGNOSIS — G40909 Epilepsy, unspecified, not intractable, without status epilepticus: Secondary | ICD-10-CM | POA: Diagnosis not present

## 2023-02-27 DIAGNOSIS — K219 Gastro-esophageal reflux disease without esophagitis: Secondary | ICD-10-CM | POA: Diagnosis not present

## 2023-02-27 DIAGNOSIS — I10 Essential (primary) hypertension: Secondary | ICD-10-CM | POA: Diagnosis not present

## 2023-02-27 DIAGNOSIS — K5900 Constipation, unspecified: Secondary | ICD-10-CM | POA: Diagnosis not present

## 2023-02-27 DIAGNOSIS — Z79899 Other long term (current) drug therapy: Secondary | ICD-10-CM | POA: Diagnosis not present

## 2023-02-28 DIAGNOSIS — I10 Essential (primary) hypertension: Secondary | ICD-10-CM | POA: Diagnosis not present

## 2023-02-28 DIAGNOSIS — E785 Hyperlipidemia, unspecified: Secondary | ICD-10-CM | POA: Diagnosis not present

## 2023-02-28 DIAGNOSIS — N183 Chronic kidney disease, stage 3 unspecified: Secondary | ICD-10-CM | POA: Diagnosis not present

## 2023-04-04 DIAGNOSIS — F330 Major depressive disorder, recurrent, mild: Secondary | ICD-10-CM | POA: Diagnosis not present

## 2023-05-04 DIAGNOSIS — F01B11 Vascular dementia, moderate, with agitation: Secondary | ICD-10-CM | POA: Diagnosis not present

## 2023-05-04 DIAGNOSIS — F331 Major depressive disorder, recurrent, moderate: Secondary | ICD-10-CM | POA: Diagnosis not present

## 2023-05-31 DIAGNOSIS — L602 Onychogryphosis: Secondary | ICD-10-CM | POA: Diagnosis not present

## 2023-05-31 DIAGNOSIS — I739 Peripheral vascular disease, unspecified: Secondary | ICD-10-CM | POA: Diagnosis not present

## 2023-05-31 DIAGNOSIS — L603 Nail dystrophy: Secondary | ICD-10-CM | POA: Diagnosis not present

## 2023-05-31 DIAGNOSIS — L84 Corns and callosities: Secondary | ICD-10-CM | POA: Diagnosis not present

## 2023-06-13 ENCOUNTER — Ambulatory Visit: Payer: Medicare (Managed Care) | Admitting: Podiatry

## 2023-06-20 ENCOUNTER — Ambulatory Visit: Payer: Medicare (Managed Care) | Admitting: Podiatry

## 2023-06-22 DIAGNOSIS — I10 Essential (primary) hypertension: Secondary | ICD-10-CM | POA: Diagnosis not present

## 2023-06-22 DIAGNOSIS — E119 Type 2 diabetes mellitus without complications: Secondary | ICD-10-CM | POA: Diagnosis not present

## 2023-06-27 DIAGNOSIS — G40909 Epilepsy, unspecified, not intractable, without status epilepticus: Secondary | ICD-10-CM | POA: Diagnosis not present

## 2023-06-27 DIAGNOSIS — N183 Chronic kidney disease, stage 3 unspecified: Secondary | ICD-10-CM | POA: Diagnosis not present

## 2023-06-27 DIAGNOSIS — E871 Hypo-osmolality and hyponatremia: Secondary | ICD-10-CM | POA: Diagnosis not present

## 2023-06-29 ENCOUNTER — Encounter: Payer: Self-pay | Admitting: Podiatry

## 2023-06-29 ENCOUNTER — Ambulatory Visit (INDEPENDENT_AMBULATORY_CARE_PROVIDER_SITE_OTHER): Payer: Medicare (Managed Care) | Admitting: Podiatry

## 2023-06-29 DIAGNOSIS — B351 Tinea unguium: Secondary | ICD-10-CM

## 2023-06-29 DIAGNOSIS — M79674 Pain in right toe(s): Secondary | ICD-10-CM

## 2023-06-29 DIAGNOSIS — N1831 Chronic kidney disease, stage 3a: Secondary | ICD-10-CM

## 2023-06-29 DIAGNOSIS — M79675 Pain in left toe(s): Secondary | ICD-10-CM | POA: Diagnosis not present

## 2023-06-29 NOTE — Progress Notes (Signed)
 This patient returns to my office for at risk foot care.  This patient requires this care by a professional since this patient will be at risk due to having CKD.    This patient is unable to trim his callus left foot.These callus are painful walking and wearing shoes.  This patient presents for at risk foot care today. Patient would not let me use the dremel tool in his treatment today.  General Appearance  Alert, conversant and in no acute stress.  Vascular  Dorsalis pedis and posterior tibial  pulses are palpable  bilaterally.  Capillary return is within normal limits  bilaterally. Temperature is within normal limits  bilaterally.  Neurologic  Senn-Weinstein monofilament wire test within normal limits  bilaterally. Muscle power within normal limits bilaterally.  Nails Thick disfigured discolored nails with subungual debris  from hallux to fifth toes bilaterally. No evidence of bacterial infection or drainage bilaterally.  Orthopedic  No limitations of motion  feet .  No crepitus or effusions noted.  No bony pathology or digital deformities noted.  Skin  normotropic skin with no porokeratosis noted bilaterally.  No signs of infections or ulcers noted.  Listers corn 5th left.  Pinch callus left hallux. Callus sub 2 left foot. Callus asymptomatic left foot.  Onychomycosis  Pain in right toes  Pain in left toesCallus left foot.  Consent was obtained for treatment procedures.   Mechanical debridement of nails 1-5  bilaterally performed with a nail nipper.  No dremel usage for nails.    Return office visit    4 months                  Told patient to return for periodic foot care and evaluation due to potential at risk complications.   Helane Gunther DPM

## 2023-07-28 DIAGNOSIS — R7303 Prediabetes: Secondary | ICD-10-CM | POA: Insufficient documentation

## 2023-08-17 DIAGNOSIS — G40909 Epilepsy, unspecified, not intractable, without status epilepticus: Secondary | ICD-10-CM | POA: Diagnosis not present

## 2023-08-17 DIAGNOSIS — I10 Essential (primary) hypertension: Secondary | ICD-10-CM | POA: Diagnosis not present

## 2023-08-17 DIAGNOSIS — D649 Anemia, unspecified: Secondary | ICD-10-CM | POA: Diagnosis not present

## 2023-08-17 DIAGNOSIS — B20 Human immunodeficiency virus [HIV] disease: Secondary | ICD-10-CM | POA: Diagnosis not present

## 2023-08-17 DIAGNOSIS — Z87820 Personal history of traumatic brain injury: Secondary | ICD-10-CM | POA: Diagnosis not present

## 2023-08-17 DIAGNOSIS — N183 Chronic kidney disease, stage 3 unspecified: Secondary | ICD-10-CM | POA: Diagnosis not present

## 2023-08-17 DIAGNOSIS — F01B3 Vascular dementia, moderate, with mood disturbance: Secondary | ICD-10-CM | POA: Diagnosis not present

## 2023-08-17 DIAGNOSIS — K219 Gastro-esophageal reflux disease without esophagitis: Secondary | ICD-10-CM | POA: Diagnosis not present

## 2023-08-24 DIAGNOSIS — G40909 Epilepsy, unspecified, not intractable, without status epilepticus: Secondary | ICD-10-CM | POA: Diagnosis not present

## 2023-08-24 DIAGNOSIS — I10 Essential (primary) hypertension: Secondary | ICD-10-CM | POA: Diagnosis not present

## 2023-08-24 DIAGNOSIS — N183 Chronic kidney disease, stage 3 unspecified: Secondary | ICD-10-CM | POA: Diagnosis not present

## 2023-08-31 DIAGNOSIS — I10 Essential (primary) hypertension: Secondary | ICD-10-CM | POA: Diagnosis not present

## 2023-09-06 DIAGNOSIS — R531 Weakness: Secondary | ICD-10-CM | POA: Diagnosis not present

## 2023-09-06 DIAGNOSIS — E876 Hypokalemia: Secondary | ICD-10-CM | POA: Diagnosis not present

## 2023-09-06 DIAGNOSIS — I129 Hypertensive chronic kidney disease with stage 1 through stage 4 chronic kidney disease, or unspecified chronic kidney disease: Secondary | ICD-10-CM | POA: Diagnosis not present

## 2023-09-06 DIAGNOSIS — Z21 Asymptomatic human immunodeficiency virus [HIV] infection status: Secondary | ICD-10-CM | POA: Diagnosis not present

## 2023-09-06 DIAGNOSIS — F0390 Unspecified dementia without behavioral disturbance: Secondary | ICD-10-CM | POA: Diagnosis not present

## 2023-09-06 DIAGNOSIS — N183 Chronic kidney disease, stage 3 unspecified: Secondary | ICD-10-CM | POA: Diagnosis not present

## 2023-09-06 DIAGNOSIS — Z743 Need for continuous supervision: Secondary | ICD-10-CM | POA: Diagnosis not present

## 2023-09-06 DIAGNOSIS — G9389 Other specified disorders of brain: Secondary | ICD-10-CM | POA: Diagnosis not present

## 2023-09-06 DIAGNOSIS — R569 Unspecified convulsions: Secondary | ICD-10-CM | POA: Diagnosis not present

## 2023-09-06 DIAGNOSIS — Z87891 Personal history of nicotine dependence: Secondary | ICD-10-CM | POA: Diagnosis not present

## 2023-09-11 DIAGNOSIS — K219 Gastro-esophageal reflux disease without esophagitis: Secondary | ICD-10-CM | POA: Diagnosis not present

## 2023-09-11 DIAGNOSIS — E785 Hyperlipidemia, unspecified: Secondary | ICD-10-CM | POA: Diagnosis not present

## 2023-09-11 DIAGNOSIS — B20 Human immunodeficiency virus [HIV] disease: Secondary | ICD-10-CM | POA: Diagnosis not present

## 2023-09-11 DIAGNOSIS — G40909 Epilepsy, unspecified, not intractable, without status epilepticus: Secondary | ICD-10-CM | POA: Diagnosis not present

## 2023-09-11 DIAGNOSIS — I10 Essential (primary) hypertension: Secondary | ICD-10-CM | POA: Diagnosis not present

## 2023-09-14 DIAGNOSIS — B20 Human immunodeficiency virus [HIV] disease: Secondary | ICD-10-CM | POA: Diagnosis not present

## 2023-09-14 DIAGNOSIS — G40909 Epilepsy, unspecified, not intractable, without status epilepticus: Secondary | ICD-10-CM | POA: Diagnosis not present

## 2023-09-14 DIAGNOSIS — I10 Essential (primary) hypertension: Secondary | ICD-10-CM | POA: Diagnosis not present

## 2023-09-14 DIAGNOSIS — F01B3 Vascular dementia, moderate, with mood disturbance: Secondary | ICD-10-CM | POA: Diagnosis not present

## 2023-09-25 DIAGNOSIS — K219 Gastro-esophageal reflux disease without esophagitis: Secondary | ICD-10-CM | POA: Diagnosis not present

## 2023-09-25 DIAGNOSIS — I10 Essential (primary) hypertension: Secondary | ICD-10-CM | POA: Diagnosis not present

## 2023-09-25 DIAGNOSIS — G40909 Epilepsy, unspecified, not intractable, without status epilepticus: Secondary | ICD-10-CM | POA: Diagnosis not present

## 2023-09-25 DIAGNOSIS — K5900 Constipation, unspecified: Secondary | ICD-10-CM | POA: Diagnosis not present

## 2023-09-26 DIAGNOSIS — F339 Major depressive disorder, recurrent, unspecified: Secondary | ICD-10-CM | POA: Diagnosis not present

## 2023-09-27 DIAGNOSIS — F01B3 Vascular dementia, moderate, with mood disturbance: Secondary | ICD-10-CM | POA: Diagnosis not present

## 2023-09-27 DIAGNOSIS — G40909 Epilepsy, unspecified, not intractable, without status epilepticus: Secondary | ICD-10-CM | POA: Diagnosis not present

## 2023-09-27 DIAGNOSIS — K5900 Constipation, unspecified: Secondary | ICD-10-CM | POA: Diagnosis not present

## 2023-09-27 DIAGNOSIS — I10 Essential (primary) hypertension: Secondary | ICD-10-CM | POA: Diagnosis not present

## 2023-09-27 DIAGNOSIS — K219 Gastro-esophageal reflux disease without esophagitis: Secondary | ICD-10-CM | POA: Diagnosis not present

## 2023-10-03 DIAGNOSIS — G40909 Epilepsy, unspecified, not intractable, without status epilepticus: Secondary | ICD-10-CM | POA: Diagnosis not present

## 2023-10-05 DIAGNOSIS — N183 Chronic kidney disease, stage 3 unspecified: Secondary | ICD-10-CM | POA: Diagnosis not present

## 2023-10-05 DIAGNOSIS — G40909 Epilepsy, unspecified, not intractable, without status epilepticus: Secondary | ICD-10-CM | POA: Diagnosis not present

## 2023-10-05 DIAGNOSIS — E785 Hyperlipidemia, unspecified: Secondary | ICD-10-CM | POA: Diagnosis not present

## 2023-10-05 DIAGNOSIS — B20 Human immunodeficiency virus [HIV] disease: Secondary | ICD-10-CM | POA: Diagnosis not present

## 2023-10-05 DIAGNOSIS — Z87820 Personal history of traumatic brain injury: Secondary | ICD-10-CM | POA: Diagnosis not present

## 2023-10-05 DIAGNOSIS — I10 Essential (primary) hypertension: Secondary | ICD-10-CM | POA: Diagnosis not present

## 2023-10-05 DIAGNOSIS — F01B3 Vascular dementia, moderate, with mood disturbance: Secondary | ICD-10-CM | POA: Diagnosis not present

## 2023-10-05 DIAGNOSIS — K219 Gastro-esophageal reflux disease without esophagitis: Secondary | ICD-10-CM | POA: Diagnosis not present

## 2023-10-06 DIAGNOSIS — K219 Gastro-esophageal reflux disease without esophagitis: Secondary | ICD-10-CM | POA: Diagnosis not present

## 2023-10-06 DIAGNOSIS — K5900 Constipation, unspecified: Secondary | ICD-10-CM | POA: Diagnosis not present

## 2023-10-06 DIAGNOSIS — F01B3 Vascular dementia, moderate, with mood disturbance: Secondary | ICD-10-CM | POA: Diagnosis not present

## 2023-10-06 DIAGNOSIS — I10 Essential (primary) hypertension: Secondary | ICD-10-CM | POA: Diagnosis not present

## 2023-10-06 DIAGNOSIS — G40909 Epilepsy, unspecified, not intractable, without status epilepticus: Secondary | ICD-10-CM | POA: Diagnosis not present

## 2023-10-20 DIAGNOSIS — K5900 Constipation, unspecified: Secondary | ICD-10-CM | POA: Diagnosis not present

## 2023-10-20 DIAGNOSIS — K219 Gastro-esophageal reflux disease without esophagitis: Secondary | ICD-10-CM | POA: Diagnosis not present

## 2023-10-20 DIAGNOSIS — F01B3 Vascular dementia, moderate, with mood disturbance: Secondary | ICD-10-CM | POA: Diagnosis not present

## 2023-10-20 DIAGNOSIS — I10 Essential (primary) hypertension: Secondary | ICD-10-CM | POA: Diagnosis not present

## 2023-10-27 DIAGNOSIS — B20 Human immunodeficiency virus [HIV] disease: Secondary | ICD-10-CM | POA: Diagnosis not present

## 2023-10-30 ENCOUNTER — Ambulatory Visit: Payer: Medicare (Managed Care) | Admitting: Podiatry

## 2023-10-30 DIAGNOSIS — Z13220 Encounter for screening for lipoid disorders: Secondary | ICD-10-CM | POA: Diagnosis not present

## 2023-10-30 DIAGNOSIS — B20 Human immunodeficiency virus [HIV] disease: Secondary | ICD-10-CM | POA: Diagnosis not present

## 2023-10-30 DIAGNOSIS — S069XAS Unspecified intracranial injury with loss of consciousness status unknown, sequela: Secondary | ICD-10-CM | POA: Diagnosis not present

## 2023-10-30 DIAGNOSIS — Z23 Encounter for immunization: Secondary | ICD-10-CM | POA: Diagnosis not present

## 2023-10-31 DIAGNOSIS — F01B3 Vascular dementia, moderate, with mood disturbance: Secondary | ICD-10-CM | POA: Diagnosis not present

## 2023-10-31 DIAGNOSIS — I10 Essential (primary) hypertension: Secondary | ICD-10-CM | POA: Diagnosis not present

## 2023-10-31 DIAGNOSIS — G40909 Epilepsy, unspecified, not intractable, without status epilepticus: Secondary | ICD-10-CM | POA: Diagnosis not present

## 2023-10-31 DIAGNOSIS — K5900 Constipation, unspecified: Secondary | ICD-10-CM | POA: Diagnosis not present

## 2023-10-31 DIAGNOSIS — B20 Human immunodeficiency virus [HIV] disease: Secondary | ICD-10-CM | POA: Diagnosis not present

## 2023-10-31 DIAGNOSIS — K219 Gastro-esophageal reflux disease without esophagitis: Secondary | ICD-10-CM | POA: Diagnosis not present

## 2023-11-03 DIAGNOSIS — B20 Human immunodeficiency virus [HIV] disease: Secondary | ICD-10-CM | POA: Diagnosis not present

## 2023-11-03 DIAGNOSIS — I10 Essential (primary) hypertension: Secondary | ICD-10-CM | POA: Diagnosis not present

## 2023-11-03 DIAGNOSIS — G40909 Epilepsy, unspecified, not intractable, without status epilepticus: Secondary | ICD-10-CM | POA: Diagnosis not present

## 2023-11-03 DIAGNOSIS — F01B3 Vascular dementia, moderate, with mood disturbance: Secondary | ICD-10-CM | POA: Diagnosis not present

## 2023-11-03 DIAGNOSIS — K5900 Constipation, unspecified: Secondary | ICD-10-CM | POA: Diagnosis not present

## 2023-11-03 DIAGNOSIS — K219 Gastro-esophageal reflux disease without esophagitis: Secondary | ICD-10-CM | POA: Diagnosis not present

## 2023-11-06 DIAGNOSIS — R7611 Nonspecific reaction to tuberculin skin test without active tuberculosis: Secondary | ICD-10-CM | POA: Diagnosis not present

## 2023-11-07 DIAGNOSIS — F01B3 Vascular dementia, moderate, with mood disturbance: Secondary | ICD-10-CM | POA: Diagnosis not present

## 2023-11-07 DIAGNOSIS — K219 Gastro-esophageal reflux disease without esophagitis: Secondary | ICD-10-CM | POA: Diagnosis not present

## 2023-11-07 DIAGNOSIS — R7612 Nonspecific reaction to cell mediated immunity measurement of gamma interferon antigen response without active tuberculosis: Secondary | ICD-10-CM | POA: Diagnosis not present

## 2023-11-07 DIAGNOSIS — K5900 Constipation, unspecified: Secondary | ICD-10-CM | POA: Diagnosis not present

## 2023-11-07 DIAGNOSIS — I10 Essential (primary) hypertension: Secondary | ICD-10-CM | POA: Diagnosis not present

## 2023-11-07 DIAGNOSIS — B20 Human immunodeficiency virus [HIV] disease: Secondary | ICD-10-CM | POA: Diagnosis not present

## 2023-11-07 DIAGNOSIS — G40909 Epilepsy, unspecified, not intractable, without status epilepticus: Secondary | ICD-10-CM | POA: Diagnosis not present

## 2023-11-08 DIAGNOSIS — F339 Major depressive disorder, recurrent, unspecified: Secondary | ICD-10-CM | POA: Diagnosis not present

## 2023-11-09 DIAGNOSIS — G40909 Epilepsy, unspecified, not intractable, without status epilepticus: Secondary | ICD-10-CM | POA: Diagnosis not present

## 2023-11-09 DIAGNOSIS — R7612 Nonspecific reaction to cell mediated immunity measurement of gamma interferon antigen response without active tuberculosis: Secondary | ICD-10-CM | POA: Diagnosis not present

## 2023-11-09 DIAGNOSIS — B20 Human immunodeficiency virus [HIV] disease: Secondary | ICD-10-CM | POA: Diagnosis not present

## 2023-11-30 DIAGNOSIS — Z23 Encounter for immunization: Secondary | ICD-10-CM | POA: Diagnosis not present

## 2023-11-30 DIAGNOSIS — B20 Human immunodeficiency virus [HIV] disease: Secondary | ICD-10-CM | POA: Diagnosis not present

## 2023-12-13 DIAGNOSIS — F339 Major depressive disorder, recurrent, unspecified: Secondary | ICD-10-CM | POA: Diagnosis not present

## 2023-12-14 DIAGNOSIS — I10 Essential (primary) hypertension: Secondary | ICD-10-CM | POA: Diagnosis not present

## 2023-12-14 DIAGNOSIS — N183 Chronic kidney disease, stage 3 unspecified: Secondary | ICD-10-CM | POA: Diagnosis not present

## 2023-12-14 DIAGNOSIS — Z87820 Personal history of traumatic brain injury: Secondary | ICD-10-CM | POA: Diagnosis not present

## 2023-12-14 DIAGNOSIS — G40909 Epilepsy, unspecified, not intractable, without status epilepticus: Secondary | ICD-10-CM | POA: Diagnosis not present

## 2023-12-14 DIAGNOSIS — B20 Human immunodeficiency virus [HIV] disease: Secondary | ICD-10-CM | POA: Diagnosis not present

## 2023-12-14 DIAGNOSIS — E785 Hyperlipidemia, unspecified: Secondary | ICD-10-CM | POA: Diagnosis not present

## 2024-01-17 DIAGNOSIS — F331 Major depressive disorder, recurrent, moderate: Secondary | ICD-10-CM | POA: Diagnosis not present

## 2024-01-17 DIAGNOSIS — G4700 Insomnia, unspecified: Secondary | ICD-10-CM | POA: Diagnosis not present

## 2024-01-26 DIAGNOSIS — K219 Gastro-esophageal reflux disease without esophagitis: Secondary | ICD-10-CM | POA: Diagnosis not present

## 2024-01-26 DIAGNOSIS — G40909 Epilepsy, unspecified, not intractable, without status epilepticus: Secondary | ICD-10-CM | POA: Diagnosis not present

## 2024-01-26 DIAGNOSIS — R1110 Vomiting, unspecified: Secondary | ICD-10-CM | POA: Diagnosis not present

## 2024-01-26 DIAGNOSIS — K5900 Constipation, unspecified: Secondary | ICD-10-CM | POA: Diagnosis not present

## 2024-01-26 DIAGNOSIS — I10 Essential (primary) hypertension: Secondary | ICD-10-CM | POA: Diagnosis not present

## 2024-02-06 DIAGNOSIS — I10 Essential (primary) hypertension: Secondary | ICD-10-CM | POA: Diagnosis not present

## 2024-02-06 DIAGNOSIS — K5900 Constipation, unspecified: Secondary | ICD-10-CM | POA: Diagnosis not present

## 2024-02-06 DIAGNOSIS — B20 Human immunodeficiency virus [HIV] disease: Secondary | ICD-10-CM | POA: Diagnosis not present

## 2024-02-06 DIAGNOSIS — K219 Gastro-esophageal reflux disease without esophagitis: Secondary | ICD-10-CM | POA: Diagnosis not present

## 2024-02-14 DIAGNOSIS — G4700 Insomnia, unspecified: Secondary | ICD-10-CM | POA: Diagnosis not present

## 2024-02-14 DIAGNOSIS — F331 Major depressive disorder, recurrent, moderate: Secondary | ICD-10-CM | POA: Diagnosis not present

## 2024-02-21 DIAGNOSIS — K219 Gastro-esophageal reflux disease without esophagitis: Secondary | ICD-10-CM | POA: Diagnosis not present

## 2024-02-21 DIAGNOSIS — K5900 Constipation, unspecified: Secondary | ICD-10-CM | POA: Diagnosis not present

## 2024-02-21 DIAGNOSIS — B20 Human immunodeficiency virus [HIV] disease: Secondary | ICD-10-CM | POA: Diagnosis not present

## 2024-02-21 DIAGNOSIS — I10 Essential (primary) hypertension: Secondary | ICD-10-CM | POA: Diagnosis not present

## 2024-02-27 DIAGNOSIS — R41841 Cognitive communication deficit: Secondary | ICD-10-CM | POA: Diagnosis not present

## 2024-02-27 DIAGNOSIS — G4089 Other seizures: Secondary | ICD-10-CM | POA: Diagnosis not present

## 2024-03-15 DIAGNOSIS — G4700 Insomnia, unspecified: Secondary | ICD-10-CM | POA: Diagnosis not present

## 2024-03-15 DIAGNOSIS — F331 Major depressive disorder, recurrent, moderate: Secondary | ICD-10-CM | POA: Diagnosis not present

## 2024-03-17 DIAGNOSIS — G40909 Epilepsy, unspecified, not intractable, without status epilepticus: Secondary | ICD-10-CM | POA: Diagnosis not present

## 2024-03-17 DIAGNOSIS — I10 Essential (primary) hypertension: Secondary | ICD-10-CM | POA: Diagnosis not present

## 2024-03-17 DIAGNOSIS — M6281 Muscle weakness (generalized): Secondary | ICD-10-CM | POA: Diagnosis not present
